# Patient Record
Sex: Male | Born: 1970 | Race: Black or African American | Hispanic: No | Smoking: Never smoker
Health system: Southern US, Community
[De-identification: ages and names within clinical notes are randomized; demographics above are authoritative.]

## PROBLEM LIST (undated history)

## (undated) DIAGNOSIS — T7840XA Allergy, unspecified, initial encounter: Secondary | ICD-10-CM

## (undated) DIAGNOSIS — E039 Hypothyroidism, unspecified: Secondary | ICD-10-CM

## (undated) DIAGNOSIS — N200 Calculus of kidney: Secondary | ICD-10-CM

## (undated) DIAGNOSIS — B009 Herpesviral infection, unspecified: Secondary | ICD-10-CM

## (undated) HISTORY — PX: LUMBAR SPINE SURGERY: SHX701

## (undated) HISTORY — DX: Allergy, unspecified, initial encounter: T78.40XA

## (undated) HISTORY — DX: Herpesviral infection, unspecified: B00.9

## (undated) HISTORY — DX: Calculus of kidney: N20.0

## (undated) HISTORY — DX: Hypothyroidism, unspecified: E03.9

---

## 2009-12-27 ENCOUNTER — Emergency Department (HOSPITAL_COMMUNITY): Admission: EM | Admit: 2009-12-27 | Discharge: 2009-12-27 | Payer: Self-pay | Admitting: Emergency Medicine

## 2010-01-14 ENCOUNTER — Emergency Department (HOSPITAL_COMMUNITY): Admission: EM | Admit: 2010-01-14 | Discharge: 2010-01-14 | Payer: Self-pay | Admitting: Emergency Medicine

## 2011-02-03 ENCOUNTER — Emergency Department (HOSPITAL_COMMUNITY): Payer: BC Managed Care – PPO

## 2011-02-03 ENCOUNTER — Emergency Department (HOSPITAL_COMMUNITY)
Admission: EM | Admit: 2011-02-03 | Discharge: 2011-02-03 | Disposition: A | Discharge status: Home / Self Care | Payer: BC Managed Care – PPO | Attending: Emergency Medicine | Admitting: Emergency Medicine

## 2011-02-03 DIAGNOSIS — R079 Chest pain, unspecified: Secondary | ICD-10-CM | POA: Insufficient documentation

## 2011-02-03 DIAGNOSIS — R002 Palpitations: Secondary | ICD-10-CM | POA: Insufficient documentation

## 2011-02-03 LAB — DIFFERENTIAL
Basophils Relative: 1 % (ref 0–1)
Eosinophils Absolute: 0.2 10*3/uL (ref 0.0–0.7)
Neutrophils Relative %: 67 % (ref 43–77)

## 2011-02-03 LAB — CBC
Platelets: 217 10*3/uL (ref 150–400)
RBC: 4.41 MIL/uL (ref 4.22–5.81)
RDW: 12.8 % (ref 11.5–15.5)
WBC: 10.3 10*3/uL (ref 4.0–10.5)

## 2011-02-03 LAB — POCT CARDIAC MARKERS
CKMB, poc: <1 ng/mL — ABNORMAL LOW (ref 1.0–8.0)
Myoglobin, poc: 66.6 ng/mL (ref 12–200)
Troponin i, poc: <0.05 ng/mL (ref 0.00–0.09)

## 2011-02-03 LAB — BASIC METABOLIC PANEL
Chloride: 106 mEq/L (ref 96–112)
Creatinine, Ser: 1.42 mg/dL (ref 0.4–1.5)
GFR calc Af Amer: >60 mL/min (ref >60)
GFR calc non Af Amer: 55 mL/min — ABNORMAL LOW (ref >60)
Potassium: 4.1 mEq/L (ref 3.5–5.1)

## 2011-03-12 ENCOUNTER — Other Ambulatory Visit: Payer: Self-pay | Admitting: Rehabilitation

## 2011-03-12 DIAGNOSIS — M542 Cervicalgia: Secondary | ICD-10-CM

## 2012-08-10 ENCOUNTER — Ambulatory Visit: Payer: BC Managed Care – PPO | Admitting: Family

## 2012-08-12 ENCOUNTER — Ambulatory Visit: Payer: BC Managed Care – PPO | Admitting: Family

## 2012-08-17 ENCOUNTER — Ambulatory Visit (INDEPENDENT_AMBULATORY_CARE_PROVIDER_SITE_OTHER): Payer: BC Managed Care – PPO | Admitting: Family

## 2012-08-17 ENCOUNTER — Encounter: Payer: Self-pay | Admitting: Family

## 2012-08-17 VITALS — BP 116/70 | HR 61 | Temp 97.8°F | Resp 12 | Ht 69.5 in | Wt 191.0 lb

## 2012-08-17 DIAGNOSIS — R599 Enlarged lymph nodes, unspecified: Secondary | ICD-10-CM

## 2012-08-17 DIAGNOSIS — R590 Localized enlarged lymph nodes: Secondary | ICD-10-CM | POA: Insufficient documentation

## 2012-08-17 DIAGNOSIS — R1030 Lower abdominal pain, unspecified: Secondary | ICD-10-CM

## 2012-08-17 DIAGNOSIS — R109 Unspecified abdominal pain: Secondary | ICD-10-CM

## 2012-08-17 DIAGNOSIS — Z0000 Encounter for general adult medical examination without abnormal findings: Secondary | ICD-10-CM | POA: Insufficient documentation

## 2012-08-17 NOTE — Patient Instructions (Addendum)
Please go to lab today. Please return fasting to the lab at your earliest convenience to complete additional lab work. We will contact you about your results.  Welcome to [facility]!

## 2012-08-17 NOTE — Assessment & Plan Note (Signed)
Patient counseled on healthy diet.  Tetanus up to date,declines flu shot. Continue regular exercise.

## 2012-08-17 NOTE — Assessment & Plan Note (Signed)
Given recent high risk sexual behavior, I have recommended an HIV test.  He declines at this time.  Reminded him on the importance of safe sex.  Will send of HSV1/2 testing, urine check for gonorrhea/chlamydia.

## 2012-08-17 NOTE — Progress Notes (Signed)
Subjective:    Patient ID: George Castro, male    DOB: [birth year], 41 y.o.   MRN: [id number]  HPI George Castro is a 41 yr old male who presents today to establish care.  He wishes to have a cpx and also to have std testing.  Preventative- works out at gym, treadmill, [employer].  Basketball/handball. Diet:  eats a lot of baked goods.   Tetanus- reports 5 yrs ago.  Declines flu shot.   Groin discomfort- Pt reports tenderness at the top of the left leg. He reports 5 male partners in the last 6 months and reports no condom use. He denies penile rashes, dysuria, or penile discharge.  He is in the process of going through a divorce and describes his recent "loose" behavior as his way of dealing with things.  He and his wife are meeting together with a therapist and he is also meeting individually with a therapist.    Review of Systems  Constitutional: Negative for unexpected weight change.  HENT: Negative for hearing loss and congestion.   Eyes:       Reading glasses  Respiratory: Negative for cough and shortness of breath.   Cardiovascular: Negative for chest pain.  Gastrointestinal: Negative for nausea, vomiting and diarrhea.  Genitourinary: Negative for dysuria, frequency and discharge.       + left groin pain  Musculoskeletal: Negative for myalgias and arthralgias.  Skin: Negative for rash.  Neurological: Negative for headaches.  Hematological: Negative for adenopathy.  Psychiatric/Behavioral:       Some stress with divorce.   Past Medical History  Diagnosis Date  . Allergy     seasonal, dust  . Depression     recent  . Kidney stones     x 4 times    History   Social History  . Marital Status: Single    Spouse Name: N/A    Number of Children: 8  . Years of Education: N/A   Occupational History  . Not on file.   Social History Main Topics  . Smoking status: Never Smoker   . Smokeless tobacco: Never Used  . Alcohol Use: Yes     occasionally pt drinks  . Drug Use: No  .  Sexually Active: Yes -- Male partner(s)   Other Topics Concern  . 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 Lumbar spine surgery     L-4 and L-5    Family History  Problem Relation Age of Onset  . Hyperlipidemia Maternal Grandmother   . Arthritis Maternal Grandmother   . Cancer Paternal Grandmother   . Alcohol abuse Paternal Grandfather     Allergies  Allergen Reactions  . Peanuts (Peanut Oil) Hives  . Shrimp (Shellfish Allergy) Hives    No current outpatient prescriptions on file prior to visit.    BP 116/70  Pulse 61  Temp 97.8 F (36.6 C) (Oral)  Resp 12  Ht 5' 9.5" (1.765 m)  Wt 191 lb (86.637 kg)  BMI 27.80 kg/m2  SpO2 97%        Objective:   Physical Exam    Physical Exam  Constitutional: He is oriented to person, place, and time. He appears well-developed and well-nourished. No distress.  HENT:  Head: Normocephalic and atraumatic.  Right Ear: Tympanic membrane and ear canal normal.  Left Ear: Tympanic membrane and ear canal normal.  Mouth/Throat: Oropharynx is clear and moist.  Eyes: Pupils are equal, round, and reactive to light. No scleral icterus.  Neck: Normal range of motion. No thyromegaly present.  Cardiovascular: Normal rate and regular rhythm.   No murmur heard. Pulmonary/Chest: Effort normal and breath sounds normal. No respiratory distress. He has no wheezes. He has no rales. He exhibits no tenderness.  Abdominal: Soft. Bowel sounds are normal. He exhibits no distension and no mass. There is no tenderness. There is no rebound and no guarding.  Musculoskeletal: He exhibits no edema.  Lymphadenopathy:    He has no cervical adenopathy. tender left inguinal LN noted- about 1 cm diameter Neurological: He is alert and oriented to person, place, and time. He has  normal reflexes. He exhibits normal muscle tone. Coordination normal.  Skin: Skin is warm and dry.  Psychiatric: He has a normal mood and affect. His behavior is normal. Judgment and thought content normal.  GU: no inguinal hernias noted on exam. No penile rashes/lesions noted.            Assessment & Plan:         Assessment & Plan:

## 2012-08-18 LAB — GC/CHLAMYDIA PROBE AMP, URINE
Chlamydia, Swab/Urine, PCR: NEGATIVE
GC Probe Amp, Urine: NEGATIVE

## 2012-08-18 LAB — HSV(HERPES SIMPLEX VRS) I + II AB-IGM: Herpes Simplex Vrs I&II-IgM Ab (EIA): 0.45 INDEX

## 2012-08-18 LAB — HSV 2 ANTIBODY, IGG: HSV 2 Glycoprotein G Ab, IgG: 8.87 IV — ABNORMAL HIGH

## 2012-08-19 ENCOUNTER — Encounter: Payer: Self-pay | Admitting: Family

## 2012-08-19 ENCOUNTER — Telehealth: Payer: Self-pay | Admitting: *Deleted

## 2012-08-19 ENCOUNTER — Telehealth: Payer: Self-pay | Admitting: Family

## 2012-08-19 ENCOUNTER — Other Ambulatory Visit: Payer: Self-pay | Admitting: Family

## 2012-08-19 DIAGNOSIS — Z0000 Encounter for general adult medical examination without abnormal findings: Secondary | ICD-10-CM

## 2012-08-19 DIAGNOSIS — B009 Herpesviral infection, unspecified: Secondary | ICD-10-CM

## 2012-08-19 HISTORY — DX: Herpesviral infection, unspecified: B00.9

## 2012-08-19 LAB — LIPID PANEL
Cholesterol: 237 mg/dL — ABNORMAL HIGH (ref 0–200)
HDL: 56 mg/dL (ref >39)
LDL Cholesterol: 164 mg/dL — ABNORMAL HIGH (ref 0–99)
Triglycerides: 87 mg/dL (ref <150)

## 2012-08-19 LAB — CBC WITH DIFFERENTIAL/PLATELET
Basophils Absolute: 0 10*3/uL (ref 0.0–0.1)
Basophils Relative: 0 % (ref 0–1)
Eosinophils Absolute: 0.1 10*3/uL (ref 0.0–0.7)
Eosinophils Relative: 1 % (ref 0–5)
HCT: 41.5 % (ref 39.0–52.0)
Hemoglobin: 14.6 g/dL (ref 13.0–17.0)
MCH: 31.5 pg (ref 26.0–34.0)
MCHC: 35.2 g/dL (ref 30.0–36.0)
MCV: 89.4 fL (ref 78.0–100.0)
Monocytes Absolute: 0.4 10*3/uL (ref 0.1–1.0)
Monocytes Relative: 6 % (ref 3–12)
RDW: 14.4 % (ref 11.5–15.5)

## 2012-08-19 LAB — BASIC METABOLIC PANEL
BUN: 12 mg/dL (ref 6–23)
CO2: 31 mEq/L (ref 19–32)
Calcium: 9.4 mg/dL (ref 8.4–10.5)
Creat: 1.29 mg/dL (ref 0.50–1.35)

## 2012-08-19 LAB — HEPATIC FUNCTION PANEL
Albumin: 4.5 g/dL (ref 3.5–5.2)
Indirect Bilirubin: 0.9 mg/dL (ref 0.0–0.9)
Total Protein: 7.5 g/dL (ref 6.0–8.3)

## 2012-08-19 MED ORDER — ACYCLOVIR 400 MG PO TABS: ORAL_TABLET | ORAL | Status: DC

## 2012-08-19 MED ORDER — DOXYCYCLINE HYCLATE 100 MG PO TABS: 100.0000 mg | ORAL_TABLET | Freq: Two times a day (BID) | ORAL | Status: DC

## 2012-08-19 NOTE — Telephone Encounter (Signed)
Pt presented to the lab. Orders entered and given to the lab.

## 2012-08-19 NOTE — Telephone Encounter (Signed)
Message copied by Kathi Simpers on Fri Aug 19, 2012 11:42 AM ------      Message from: O'SULLIVAN, MELISSA      Created: Wed Aug 17, 2012 11:59 AM       Patient will return fasting for:            CBC      BMET      LFT      TSH      FLP      UA with reflex micro            V70.0

## 2012-08-19 NOTE — Telephone Encounter (Signed)
Spoke to pt reviewed lab results.  + HSV2.  Discussed that he can pass this infection to sexual partners even if no lesions present.  Recommended condom use all the time to decrease this risk. He reports that he is still having groin swelling. Also has some pain in the left upper testicular area.  Will plan empiric rx for epididymitis with doxy.  He has apt on Monday. Plan empiric IM ceftriaxone when he comes in that day.   Will rx hsv with acyclovir.  Pt aware. Questions answered.

## 2012-08-20 LAB — URINALYSIS, ROUTINE W REFLEX MICROSCOPIC
Bilirubin Urine: NEGATIVE
Glucose, UA: NEGATIVE mg/dL
Hgb urine dipstick: NEGATIVE
Leukocytes, UA: NEGATIVE
Protein, ur: NEGATIVE mg/dL
pH: 7.5 (ref 5.0–8.0)

## 2012-08-22 ENCOUNTER — Ambulatory Visit: Payer: BC Managed Care – PPO | Admitting: Family

## 2012-08-22 DIAGNOSIS — Z0289 Encounter for other administrative examinations: Secondary | ICD-10-CM

## 2012-08-25 ENCOUNTER — Encounter: Payer: Self-pay | Admitting: Family

## 2012-08-25 ENCOUNTER — Telehealth: Payer: Self-pay | Admitting: Family

## 2012-08-25 DIAGNOSIS — E039 Hypothyroidism, unspecified: Secondary | ICD-10-CM

## 2012-08-25 HISTORY — DX: Hypothyroidism, unspecified: E03.9

## 2012-08-25 MED ORDER — LEVOTHYROXINE SODIUM 50 MCG PO TABS: 50.0000 ug | ORAL_TABLET | Freq: Every day | ORAL | Status: DC

## 2012-08-25 NOTE — Telephone Encounter (Signed)
Please call pt and let him know that his blood work shows underactive thyroid.  I would like him to add synthroid once daily and return to the lab in 1 month for follow up TSH (diagnosis is hypothyroid). Also, cholesterol is mildly elevated.  He should work on a low fat/low cholesterol diet and continue regular exercise.

## 2012-08-29 NOTE — Telephone Encounter (Signed)
Notified pt of instructions below. Pt became concerned about the cause of his current diagnosis. Discussed some causes of hypothyroidism with pt. Pt doesn't feel any of the causes discussed explain his recent diagnosis. Pt has f/u with Provider on Wednesday and will discuss his concerns in detail at that time. Future lab order entered. Copy given to the lab and mailed to pt.

## 2012-08-31 ENCOUNTER — Encounter: Payer: Self-pay | Admitting: Family

## 2012-08-31 ENCOUNTER — Ambulatory Visit (INDEPENDENT_AMBULATORY_CARE_PROVIDER_SITE_OTHER): Payer: BC Managed Care – PPO | Admitting: Family

## 2012-08-31 VITALS — BP 128/84 | HR 59 | Temp 98.4°F | Resp 16 | Wt 192.1 lb

## 2012-08-31 DIAGNOSIS — M25559 Pain in unspecified hip: Secondary | ICD-10-CM | POA: Insufficient documentation

## 2012-08-31 DIAGNOSIS — R21 Rash and other nonspecific skin eruption: Secondary | ICD-10-CM | POA: Insufficient documentation

## 2012-08-31 DIAGNOSIS — E039 Hypothyroidism, unspecified: Secondary | ICD-10-CM

## 2012-08-31 DIAGNOSIS — M25551 Pain in right hip: Secondary | ICD-10-CM | POA: Insufficient documentation

## 2012-08-31 NOTE — Assessment & Plan Note (Signed)
We discussed causes and treatment for hypothyroid. Pt education hand out was provided. Will start synthroid.  Plan follow up TSH in 1 month.

## 2012-08-31 NOTE — Progress Notes (Signed)
  Subjective:    Patient ID: George Castro, male    DOB: [birth year], 41 y.o.   MRN: [id number]  HPI  George Castro is a 41 yr old male who presents today to discuss discoloration on his left ankle.  1) Left ankle- noted darkened rash 3-4 yrs ago. Reports that he has tried antifungal creams without improvement. Area has recently become larger.  Area itches "occasionally."  2) Hip pain- notes that he has some tenderness to palpation of the left anterior hip.    3) Hypothyroid- incidental note of hypothyroid on cpx labs.       Review of Systems    see HPI  Past Medical History  Diagnosis Date  . Allergy     seasonal, dust  . Depression     recent  . Kidney stones     x 4 times  . 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 Hypothyroid 08/25/2012    History   Social History  . Marital Status: Single    Spouse Name: N/A    Number of Children: 8  . Years of Education: N/A   Occupational History  . Not on file.   Social History Main Topics  . Smoking status: Never Smoker   . Smokeless tobacco: Never Used  . Alcohol Use: Yes     occasionally pt drinks  . Drug Use: No  . Sexually Active: Yes -- Male partner(s)   Other Topics Concern  . 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 Lumbar spine surgery     L-4 and L-5    Family History  Problem Relation Age of Onset  . Hyperlipidemia Maternal Grandmother   . Arthritis Maternal Grandmother   . Cancer Paternal Grandmother   . Alcohol abuse Paternal Grandfather     Allergies  Allergen Reactions  . Peanuts (Peanut Oil) Hives  . Shrimp (Shellfish Allergy) Hives  . Penicillins Hives    Current Outpatient Prescriptions on File Prior to Visit  Medication Sig Dispense Refill  . acyclovir (ZOVIRAX) 400 MG tablet One tablet three  times daily for 5 days as needed for genital rash or swollen glands in groin.  15 tablet  5  . levothyroxine (SYNTHROID, LEVOTHROID) 50 MCG tablet Take 1 tablet (50 mcg total) by mouth daily.  30 tablet  1  . doxycycline (VIBRA-TABS) 100 MG tablet Take 1 tablet (100 mg total) by mouth 2 (two) times daily.  20 tablet  0    BP 128/84  Pulse 59  Temp 98.4 F (36.9 C) (Oral)  Resp 16  Wt 192 lb 1.9 oz (87.145 kg)  SpO2 98%    Objective:   Physical Exam  Gen: awake, alert, NAD CV: S1/S2, RRR Resp: BS cta bilaterally no w/r/r Skin: dark dry flat hyperpigmented lesion noted on left lateral ankle. Lesion about 2 inches wide. Psych: A and O x 3, calm, appropriate affect MS: full rom of left hip without pain.     Assessment & Plan:

## 2012-08-31 NOTE — Assessment & Plan Note (Signed)
We discussed possible referral to sports medicine but he declines at this time.

## 2012-08-31 NOTE — Assessment & Plan Note (Signed)
Rash looks fungal but he reports no improvement with antifungal creams. Refer to dermatology.

## 2012-08-31 NOTE — Patient Instructions (Addendum)
Please call if your hip pain worsens or if no improvement in 1 month. Start thyroid medication and return to lab in 1 month so that we can recheck thyroid blood work. You will be contact about your referral to dermatology.  Please let us know if you have not heard back within 1 week about your referral.

## 2012-09-01 ENCOUNTER — Telehealth: Payer: Self-pay | Admitting: *Deleted

## 2012-09-01 DIAGNOSIS — R1030 Lower abdominal pain, unspecified: Secondary | ICD-10-CM

## 2012-09-01 MED ORDER — MELOXICAM 7.5 MG PO TABS: 7.5000 mg | ORAL_TABLET | Freq: Every day | ORAL | Status: DC

## 2012-09-01 MED ORDER — CIPROFLOXACIN HCL 500 MG PO TABS: 500.0000 mg | ORAL_TABLET | Freq: Two times a day (BID) | ORAL | Status: DC

## 2012-09-01 NOTE — Telephone Encounter (Signed)
LMOM with contact name & number for return call RE: response from provider/SLS

## 2012-09-01 NOTE — Telephone Encounter (Signed)
Called pt, no answer did not leave message.  Please try pt back later.  When you speak to him let him know that I have sent rx to his pharmacy for cipro an antibiotic.  This can help in case the pain is related to prostatitis (a common infection of the prostate).  I also have placed an order below for scrotal ultrasound to check for any abnormality of his testicles given his ongoing pain. Rx sent for mobic once daily as needed for pain as well.

## 2012-09-01 NOTE — Telephone Encounter (Signed)
Received message from pt stating he is still having groin / testicle pain. Reports that previous medication has helped some but he is requesting something to help take the pain away.  Please advise.

## 2012-09-02 ENCOUNTER — Telehealth: Payer: Self-pay | Admitting: Family

## 2012-09-02 NOTE — Telephone Encounter (Signed)
Documentation error below, unable to reach pt or leave message.

## 2012-09-02 NOTE — Telephone Encounter (Signed)
See additional phone note from 09/02/12.

## 2012-09-02 NOTE — Telephone Encounter (Signed)
Left message on home # for pt to return my call. 

## 2012-09-02 NOTE — Telephone Encounter (Signed)
Notified pt, unable to reach pt.

## 2012-09-02 NOTE — Telephone Encounter (Signed)
No, we will start with ultrasound. He should go to er if he develops worsening or severe pain.

## 2012-09-02 NOTE — Telephone Encounter (Signed)
Received call from imaging wanting to know if we want arterial doppler added on to the scrotal u/s pt will have on Monday?  Please advise.

## 2012-09-02 NOTE — Telephone Encounter (Signed)
Caller: Ellijah/Patient; Patient Name: George Castro; PCP: Peggyann Juba, Melissa (Adults only); Best Callback Phone Number: [phone]  Patient states he received a voicemail message to return call to [place]. RN reviewed Counselling psychologist Health Record. Documentation noted per Sandford Craze NP from 09/01/12: sent rx to his pharmacy for cipro an antibiotic.  This can help in case the pain is related to prostatitis (a common infection of the prostate).  I also have placed an order below for scrotal ultrasound to check for any abnormality of his testicles given his ongoing pain. Rx sent for mobic once daily as needed for pain as well.  Patient provided with above information. Patient informed that Rx medications were sent to [facility] on [address] at [address]. Patient informed that Ultrasound order was sent to [facility]. Patient verbalizes understanding.

## 2012-09-05 ENCOUNTER — Ambulatory Visit (HOSPITAL_BASED_OUTPATIENT_CLINIC_OR_DEPARTMENT_OTHER): Admission: RE | Admit: 2012-09-05 | Payer: BC Managed Care – PPO | Source: Ambulatory Visit

## 2012-09-07 NOTE — Telephone Encounter (Signed)
Per verbal from Provider, ok to refill meloxicam but hold off on antibiotic as pt should complete u/s in order for [place] to determine source of pain and proper treatment.

## 2012-09-07 NOTE — Telephone Encounter (Signed)
Received voicemail from pt requesting additional rx for doxycycline and meloxicam to be sent to [facility]. Upon review of chart it appears pt did not show for u/s. Verified with imaging that pt did not show and u/s has not been r/s. Attempted to reach pt and left message to return my call.

## 2012-09-08 ENCOUNTER — Telehealth: Payer: Self-pay | Admitting: Family

## 2012-09-08 MED ORDER — CIPROFLOXACIN HCL 500 MG PO TABS: 500.0000 mg | ORAL_TABLET | Freq: Two times a day (BID) | ORAL | Status: DC

## 2012-09-08 MED ORDER — MELOXICAM 7.5 MG PO TABS: 7.5000 mg | ORAL_TABLET | Freq: Every day | ORAL | Status: DC

## 2012-09-08 NOTE — Telephone Encounter (Signed)
Spoke with pt and advised him of below instructions. Rxs sent to [facility] and pt was transferred to radiology to r/s ultrasound.

## 2012-09-08 NOTE — Telephone Encounter (Signed)
After further thought, if pt is still having pain I would like him to star cipro 500 mg bid x 10 days. Please reschedule scrotal US with arterial doppler. Ok to refill meloxicam. Thanks.

## 2012-09-08 NOTE — Telephone Encounter (Signed)
Refills completed, see 09/08/12 phone note.

## 2012-09-09 ENCOUNTER — Ambulatory Visit (HOSPITAL_BASED_OUTPATIENT_CLINIC_OR_DEPARTMENT_OTHER)
Admission: RE | Admit: 2012-09-09 | Discharge: 2012-09-09 | Disposition: A | Discharge status: Home / Self Care | Payer: BC Managed Care – PPO | Source: Ambulatory Visit | Attending: Family | Admitting: Family

## 2012-09-09 ENCOUNTER — Telehealth: Payer: Self-pay | Admitting: Family

## 2012-09-09 DIAGNOSIS — N509 Disorder of male genital organs, unspecified: Secondary | ICD-10-CM | POA: Insufficient documentation

## 2012-09-09 DIAGNOSIS — R1030 Lower abdominal pain, unspecified: Secondary | ICD-10-CM

## 2012-09-09 MED ORDER — ACYCLOVIR 400 MG PO TABS: 400.0000 mg | ORAL_TABLET | Freq: Two times a day (BID) | ORAL | Status: DC

## 2012-09-09 MED ORDER — ACYCLOVIR 400 MG PO TABS: ORAL_TABLET | ORAL | Status: DC

## 2012-09-09 NOTE — Telephone Encounter (Signed)
Spoke with pt. Reviewed normal ultrasound.  He reports he continues to have left sided groin discomfort.  Could still be related to HSV2 infection.  Will give valtrex as suppressive therapy. Continue empiric cipro for possible prostatitis.  meloxicam for pain.  We again discussed importance of hiv screening.  Pt reports "I'm not there yet."  Instructed pt to follow up in 1 month- sooner if problems. Pt verbalizes understanding.

## 2012-09-09 NOTE — Addendum Note (Signed)
Addended by: Sandford Craze on: 09/09/2012 01:04 PM   Modules accepted: Orders

## 2012-09-09 NOTE — Addendum Note (Signed)
Addended by: Sandford Craze on: 09/09/2012 02:08 PM   Modules accepted: Orders

## 2012-11-07 ENCOUNTER — Ambulatory Visit (INDEPENDENT_AMBULATORY_CARE_PROVIDER_SITE_OTHER): Payer: BC Managed Care – PPO | Admitting: Family

## 2012-11-07 ENCOUNTER — Encounter: Payer: Self-pay | Admitting: Family

## 2012-11-07 VITALS — BP 120/86 | HR 64 | Temp 99.8°F | Resp 16 | Wt 200.1 lb

## 2012-11-07 DIAGNOSIS — E039 Hypothyroidism, unspecified: Secondary | ICD-10-CM

## 2012-11-07 DIAGNOSIS — R590 Localized enlarged lymph nodes: Secondary | ICD-10-CM

## 2012-11-07 DIAGNOSIS — M25559 Pain in unspecified hip: Secondary | ICD-10-CM

## 2012-11-07 DIAGNOSIS — R599 Enlarged lymph nodes, unspecified: Secondary | ICD-10-CM

## 2012-11-07 DIAGNOSIS — Z7251 High risk heterosexual behavior: Secondary | ICD-10-CM

## 2012-11-07 DIAGNOSIS — Z202 Contact with and (suspected) exposure to infections with a predominantly sexual mode of transmission: Secondary | ICD-10-CM | POA: Insufficient documentation

## 2012-11-07 DIAGNOSIS — Z9189 Other specified personal risk factors, not elsewhere classified: Secondary | ICD-10-CM

## 2012-11-07 MED ORDER — MELOXICAM 7.5 MG PO TABS: 7.5000 mg | ORAL_TABLET | Freq: Every day | ORAL | Status: DC

## 2012-11-07 MED ORDER — LEVOTHYROXINE SODIUM 50 MCG PO TABS: 50.0000 ug | ORAL_TABLET | Freq: Every day | ORAL | Status: DC

## 2012-11-07 NOTE — Assessment & Plan Note (Signed)
Resume synthroid, plan to check tsh in 1 month.

## 2012-11-07 NOTE — Assessment & Plan Note (Signed)
Pt reports that condom broke during intercourse. He would like std testing, obtain rpr, GC/Clamydia.  Declines HIV testing

## 2012-11-07 NOTE — Progress Notes (Signed)
  Subjective:    Patient ID: George Castro, male    DOB: Aug 04, 1971, 42 y.o.   MRN: [id number]  HPI  George Castro is a 42 yr old male who presents today requesting STD testing.  Cough/nasal congestion x 2 days. + sick contacts   Hypothyroid- has been out of synthoid x "a few months."   Groin pain- pt reports pain in the left upper anterior thigh, occasionally groin and in left buttock overlying SI joint.  Pain improves with stretching.     Review of Systems See HPI  Past Medical History  Diagnosis Date  . Allergy     seasonal, dust  . Depression     recent  . Kidney stones     x 4 times  . 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 Hypothyroid 08/25/2012    History   Social History  . Marital Status: Single    Spouse Name: N/A    Number of Children: 8  . Years of Education: N/A   Occupational History  . Not on file.   Social History Main Topics  . Smoking status: Never Smoker   . Smokeless tobacco: Never Used  . Alcohol Use: Yes     Comment: occasionally pt drinks  . Drug Use: No  . Sexually Active: Yes -- Male partner(s)   Other Topics Concern  . 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 Lumbar spine surgery     L-4 and L-5    Family History  Problem Relation Age of Onset  . Hyperlipidemia Maternal Grandmother   . Arthritis Maternal Grandmother   . Cancer Paternal Grandmother   . Alcohol abuse Paternal Grandfather     Allergies  Allergen Reactions  . Peanuts (Peanut Oil) Hives  . Shrimp (Shellfish Allergy) Hives  . Penicillins Hives    Current Outpatient Prescriptions on File Prior to Visit  Medication Sig Dispense Refill  . acyclovir (ZOVIRAX) 400 MG tablet One tablet by mouth twice daily  60 tablet  2    BP 120/86  Pulse 64  Temp 99.8 F (37.7 C)  (Oral)  Resp 16  Wt 200 lb 1.3 oz (90.756 kg)  SpO2 97%       Objective:   Physical Exam  Constitutional: He is oriented to person, place, and time. He appears well-developed and well-nourished. No distress.  HENT:  Right Ear: Tympanic membrane and ear canal normal.  Left Ear: Tympanic membrane and ear canal normal.  Mouth/Throat: No oropharyngeal exudate or posterior oropharyngeal edema.  Cardiovascular: Normal rate and regular rhythm.   No murmur heard. Pulmonary/Chest: Effort normal and breath sounds normal. No respiratory distress. He has no wheezes. He has no rales. He exhibits no tenderness.  Lymphadenopathy:    He has no cervical adenopathy.  Neurological: He is alert and oriented to person, place, and time.  Psychiatric: He has a normal mood and affect. His behavior is normal. Judgment and thought content normal.          Assessment & Plan:  URI- recommended supportive measures and to call if symptoms worsen or do not improve in the next few days.

## 2012-11-07 NOTE — Assessment & Plan Note (Signed)
Trial of meloxicam.  If no improvement plan referral to sports medicine.

## 2012-11-07 NOTE — Patient Instructions (Addendum)
Please complete your lab work prior to leaving.  Follow up in 1 month to have your thyroid level checked in the lab. Happy New Year!

## 2012-11-08 ENCOUNTER — Telehealth: Payer: Self-pay | Admitting: Family

## 2012-11-08 LAB — RPR

## 2012-11-08 LAB — GC/CHLAMYDIA PROBE AMP, URINE
Chlamydia, Swab/Urine, PCR: NEGATIVE
GC Probe Amp, Urine: NEGATIVE

## 2012-11-08 NOTE — Telephone Encounter (Signed)
His cold is viral- abx are not indicated. he can try otc med such as dayquil prn .  If symptoms are not resolved by 7-10 days, he should let us know.

## 2012-11-08 NOTE — Telephone Encounter (Signed)
Please advise 

## 2012-11-08 NOTE — Telephone Encounter (Signed)
Per verbal from Provider, pt has been notified.

## 2012-11-08 NOTE — Telephone Encounter (Signed)
Patient states that he thought Efraim Kaufmann was going to call him in something for his cold. He said that there were only two medication at the pharmacy yesterday when he went by there.

## 2012-11-08 NOTE — Telephone Encounter (Signed)
Attempted to reach pt, left message on cell # to return my call. (also see lab note).

## 2012-11-11 ENCOUNTER — Telehealth: Payer: Self-pay | Admitting: Family

## 2012-11-11 NOTE — Telephone Encounter (Signed)
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   Fax# where this needs to go is [phone]

## 2012-11-11 NOTE — Telephone Encounter (Signed)
Notified pt that he did not provide this information to [place] when he established care in 08/2012 and we have no record of previous immunizations. Advised pt to contact previous Provider, college or high school. Pt voices understanding.

## 2012-12-05 ENCOUNTER — Ambulatory Visit (INDEPENDENT_AMBULATORY_CARE_PROVIDER_SITE_OTHER): Payer: BC Managed Care – PPO | Admitting: Family

## 2012-12-05 ENCOUNTER — Encounter: Payer: Self-pay | Admitting: Family

## 2012-12-05 VITALS — BP 120/80 | HR 60 | Temp 98.0°F | Resp 16 | Ht 69.5 in | Wt 201.0 lb

## 2012-12-05 DIAGNOSIS — Z91018 Allergy to other foods: Secondary | ICD-10-CM

## 2012-12-05 DIAGNOSIS — E039 Hypothyroidism, unspecified: Secondary | ICD-10-CM

## 2012-12-05 DIAGNOSIS — T781XXA Other adverse food reactions, not elsewhere classified, initial encounter: Secondary | ICD-10-CM

## 2012-12-05 MED ORDER — EPINEPHRINE 0.3 MG/0.3ML IJ DEVI: INTRAMUSCULAR | Status: DC

## 2012-12-05 MED ORDER — LEVOTHYROXINE SODIUM 50 MCG PO TABS: 50.0000 ug | ORAL_TABLET | Freq: Every day | ORAL | Status: DC

## 2012-12-05 NOTE — Assessment & Plan Note (Signed)
Reports feeling sleepy, but he is only getting about 6 hours a night of sleep.  Recommended that he try to aim for 8 hours a night. Check TSH, adjust as needed.

## 2012-12-05 NOTE — Progress Notes (Signed)
  Subjective:    Patient ID: George Castro, male    DOB: 1971-01-10, 42 y.o.   MRN: [id number]  HPI  George Castro is a 42 yr old male who presents today for follow up of his hypothyroidism.  He is currently maintained on levothyroxind 50 mg.  Reports feeling tired.  He started classes at [employer] and has been walking more.  Feels tired.      Review of Systems    see HPI  Past Medical History  Diagnosis Date  . Allergy     seasonal, dust  . Depression     recent  . Kidney stones     x 4 times  . 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 Hypothyroid 08/25/2012    History   Social History  . Marital Status: Single    Spouse Name: N/A    Number of Children: 8  . Years of Education: N/A   Occupational History  . Not on file.   Social History Main Topics  . Smoking status: Never Smoker   . Smokeless tobacco: Never Used  . Alcohol Use: Yes     Comment: occasionally pt drinks  . Drug Use: No  . Sexually Active: Yes -- Male partner(s)   Other Topics Concern  . 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 Lumbar spine surgery     L-4 and L-5    Family History  Problem Relation Age of Onset  . Hyperlipidemia Maternal Grandmother   . Arthritis Maternal Grandmother   . Cancer Paternal Grandmother   . Alcohol abuse Paternal Grandfather     Allergies  Allergen Reactions  . Peanuts (Peanut Oil) Hives  . Shrimp (Shellfish Allergy) Hives  . Penicillins Hives    Current Outpatient Prescriptions on File Prior to Visit  Medication Sig Dispense Refill  . acyclovir (ZOVIRAX) 400 MG tablet One tablet by mouth twice daily  60 tablet  2  . levothyroxine (SYNTHROID, LEVOTHROID) 50 MCG tablet Take 1 tablet (50 mcg total) by mouth daily.  30 tablet  0  . meloxicam (MOBIC) 7.5 MG tablet Take  1 tablet (7.5 mg total) by mouth daily.  12 tablet  0    BP 120/80  Pulse 60  Temp 98 F (36.7 C) (Oral)  Resp 16  Ht 5' 9.5" (1.765 m)  Wt 201 lb (91.173 kg)  BMI 29.26 kg/m2  SpO2 98%    Objective:   Physical Exam  Constitutional: He is oriented to person, place, and time. He appears well-developed and well-nourished. No distress.  Cardiovascular: Normal rate and regular rhythm.   No murmur heard. Musculoskeletal: He exhibits no edema.  Neurological: He is alert and oriented to person, place, and time.  Skin: Skin is warm and dry.  Psychiatric: He has a normal mood and affect. His behavior is normal. Judgment and thought content normal.          Assessment & Plan:

## 2012-12-05 NOTE — Assessment & Plan Note (Signed)
Pt with multiple food allergies. We discussed importance of keeping epi pen on hand in case of severe allergic reaction.  If symptoms are severe he is instructed to use epi pen and go directly to the ED.  Pt verbalizes understanding.

## 2012-12-05 NOTE — Patient Instructions (Addendum)
Please complete your lab work prior to leaving. Please schedule a follow up appointment in 6 months.   

## 2013-03-23 ENCOUNTER — Emergency Department (HOSPITAL_BASED_OUTPATIENT_CLINIC_OR_DEPARTMENT_OTHER): Payer: BC Managed Care – PPO

## 2013-03-23 ENCOUNTER — Encounter (HOSPITAL_BASED_OUTPATIENT_CLINIC_OR_DEPARTMENT_OTHER): Payer: Self-pay | Admitting: *Deleted

## 2013-03-23 ENCOUNTER — Emergency Department (HOSPITAL_BASED_OUTPATIENT_CLINIC_OR_DEPARTMENT_OTHER)
Admission: EM | Admit: 2013-03-23 | Discharge: 2013-03-23 | Disposition: A | Discharge status: Home / Self Care | Payer: BC Managed Care – PPO | Attending: Emergency Medicine | Admitting: Emergency Medicine

## 2013-03-23 DIAGNOSIS — Z87442 Personal history of urinary calculi: Secondary | ICD-10-CM | POA: Insufficient documentation

## 2013-03-23 DIAGNOSIS — E039 Hypothyroidism, unspecified: Secondary | ICD-10-CM | POA: Insufficient documentation

## 2013-03-23 DIAGNOSIS — S0993XA Unspecified injury of face, initial encounter: Secondary | ICD-10-CM | POA: Insufficient documentation

## 2013-03-23 DIAGNOSIS — M20019 Mallet finger of unspecified finger(s): Secondary | ICD-10-CM | POA: Insufficient documentation

## 2013-03-23 DIAGNOSIS — F3289 Other specified depressive episodes: Secondary | ICD-10-CM | POA: Insufficient documentation

## 2013-03-23 DIAGNOSIS — Z79899 Other long term (current) drug therapy: Secondary | ICD-10-CM | POA: Insufficient documentation

## 2013-03-23 DIAGNOSIS — S6980XA Other specified injuries of unspecified wrist, hand and finger(s), initial encounter: Secondary | ICD-10-CM | POA: Insufficient documentation

## 2013-03-23 DIAGNOSIS — M20011 Mallet finger of right finger(s): Secondary | ICD-10-CM

## 2013-03-23 DIAGNOSIS — IMO0002 Reserved for concepts with insufficient information to code with codable children: Secondary | ICD-10-CM | POA: Insufficient documentation

## 2013-03-23 DIAGNOSIS — Y9389 Activity, other specified: Secondary | ICD-10-CM | POA: Insufficient documentation

## 2013-03-23 DIAGNOSIS — T07XXXA Unspecified multiple injuries, initial encounter: Secondary | ICD-10-CM

## 2013-03-23 DIAGNOSIS — B009 Herpesviral infection, unspecified: Secondary | ICD-10-CM | POA: Insufficient documentation

## 2013-03-23 DIAGNOSIS — S6990XA Unspecified injury of unspecified wrist, hand and finger(s), initial encounter: Secondary | ICD-10-CM | POA: Insufficient documentation

## 2013-03-23 DIAGNOSIS — Z880 Allergy status to penicillin: Secondary | ICD-10-CM | POA: Insufficient documentation

## 2013-03-23 DIAGNOSIS — F329 Major depressive disorder, single episode, unspecified: Secondary | ICD-10-CM | POA: Insufficient documentation

## 2013-03-23 DIAGNOSIS — Y92410 Unspecified street and highway as the place of occurrence of the external cause: Secondary | ICD-10-CM | POA: Insufficient documentation

## 2013-03-23 LAB — CBC WITH DIFFERENTIAL/PLATELET
Basophils Absolute: 0 10*3/uL (ref 0.0–0.1)
Basophils Relative: 0 % (ref 0–1)
Eosinophils Relative: 1 % (ref 0–5)
HCT: 41.5 % (ref 39.0–52.0)
Hemoglobin: 14.3 g/dL (ref 13.0–17.0)
MCHC: 34.5 g/dL (ref 30.0–36.0)
MCV: 91.8 fL (ref 78.0–100.0)
Monocytes Absolute: 1 10*3/uL (ref 0.1–1.0)
Monocytes Relative: 10 % (ref 3–12)
Neutro Abs: 7.2 10*3/uL (ref 1.7–7.7)
RDW: 12.6 % (ref 11.5–15.5)

## 2013-03-23 LAB — URINALYSIS, ROUTINE W REFLEX MICROSCOPIC
Glucose, UA: NEGATIVE mg/dL
Hgb urine dipstick: NEGATIVE
Ketones, ur: 15 mg/dL — AB
Protein, ur: NEGATIVE mg/dL
pH: 5.5 (ref 5.0–8.0)

## 2013-03-23 LAB — BASIC METABOLIC PANEL
BUN: 15 mg/dL (ref 6–23)
CO2: 27 mEq/L (ref 19–32)
Calcium: 9.4 mg/dL (ref 8.4–10.5)
Chloride: 104 mEq/L (ref 96–112)
Creatinine, Ser: 1.4 mg/dL — ABNORMAL HIGH (ref 0.50–1.35)
GFR calc Af Amer: 70 mL/min — ABNORMAL LOW (ref >90)

## 2013-03-23 MED ORDER — HYDROCODONE-ACETAMINOPHEN 5-325 MG PO TABS
1.0000 | ORAL_TABLET | Freq: Once | ORAL | Status: AC
  Administered 2013-03-23: 1 via ORAL
  Filled 2013-03-23: qty 1

## 2013-03-23 MED ORDER — HYDROCODONE-ACETAMINOPHEN 5-325 MG PO TABS: 1.0000 | ORAL_TABLET | ORAL | Status: DC | PRN

## 2013-03-23 MED ORDER — SILVER SULFADIAZINE 1 % EX CREA
TOPICAL_CREAM | CUTANEOUS | Status: AC
  Administered 2013-03-23: 15:00:00
  Filled 2013-03-23: qty 85

## 2013-03-23 MED ORDER — SODIUM CHLORIDE 0.9 % IV SOLN
INTRAVENOUS | Status: DC
  Administered 2013-03-23: 13:00:00 via INTRAVENOUS

## 2013-03-23 MED ORDER — IOHEXOL 300 MG/ML  SOLN
100.0000 mL | Freq: Once | INTRAMUSCULAR | Status: AC | PRN
  Administered 2013-03-23: 100 mL via INTRAVENOUS

## 2013-03-23 NOTE — ED Notes (Signed)
John, EMT remains at bedside for wound care.

## 2013-03-23 NOTE — ED Notes (Signed)
Report received from Melissa, RN, care assumed.
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
## 2013-03-23 NOTE — ED Notes (Signed)
Patient transported to CT 

## 2013-03-27 ENCOUNTER — Encounter (HOSPITAL_BASED_OUTPATIENT_CLINIC_OR_DEPARTMENT_OTHER): Payer: Self-pay | Admitting: *Deleted

## 2013-03-27 ENCOUNTER — Emergency Department (HOSPITAL_BASED_OUTPATIENT_CLINIC_OR_DEPARTMENT_OTHER)
Admission: EM | Admit: 2013-03-27 | Discharge: 2013-03-27 | Disposition: A | Discharge status: Home / Self Care | Payer: BC Managed Care – PPO | Attending: Emergency Medicine | Admitting: Emergency Medicine

## 2013-03-27 DIAGNOSIS — Z87442 Personal history of urinary calculi: Secondary | ICD-10-CM | POA: Insufficient documentation

## 2013-03-27 DIAGNOSIS — Z8659 Personal history of other mental and behavioral disorders: Secondary | ICD-10-CM | POA: Insufficient documentation

## 2013-03-27 DIAGNOSIS — Z880 Allergy status to penicillin: Secondary | ICD-10-CM | POA: Insufficient documentation

## 2013-03-27 DIAGNOSIS — Y92410 Unspecified street and highway as the place of occurrence of the external cause: Secondary | ICD-10-CM | POA: Insufficient documentation

## 2013-03-27 DIAGNOSIS — S70929A Unspecified superficial injury of unspecified thigh, initial encounter: Secondary | ICD-10-CM | POA: Insufficient documentation

## 2013-03-27 DIAGNOSIS — Y9389 Activity, other specified: Secondary | ICD-10-CM | POA: Insufficient documentation

## 2013-03-27 DIAGNOSIS — Z79899 Other long term (current) drug therapy: Secondary | ICD-10-CM | POA: Insufficient documentation

## 2013-03-27 DIAGNOSIS — T07XXXA Unspecified multiple injuries, initial encounter: Secondary | ICD-10-CM

## 2013-03-27 DIAGNOSIS — Z8619 Personal history of other infectious and parasitic diseases: Secondary | ICD-10-CM | POA: Insufficient documentation

## 2013-03-27 DIAGNOSIS — E039 Hypothyroidism, unspecified: Secondary | ICD-10-CM | POA: Insufficient documentation

## 2013-03-27 DIAGNOSIS — S70919A Unspecified superficial injury of unspecified hip, initial encounter: Secondary | ICD-10-CM | POA: Insufficient documentation

## 2013-03-27 DIAGNOSIS — S90919A Unspecified superficial injury of unspecified ankle, initial encounter: Secondary | ICD-10-CM | POA: Insufficient documentation

## 2013-03-27 DIAGNOSIS — S80929A Unspecified superficial injury of unspecified lower leg, initial encounter: Secondary | ICD-10-CM | POA: Insufficient documentation

## 2013-03-27 MED ORDER — SILVER SULFADIAZINE 1 % EX CREA: TOPICAL_CREAM | Freq: Every day | CUTANEOUS | Status: DC

## 2013-03-27 MED ORDER — SILVER SULFADIAZINE 1 % EX CREA
TOPICAL_CREAM | Freq: Once | CUTANEOUS | Status: AC
  Administered 2013-03-27: 19:00:00 via TOPICAL
  Filled 2013-03-27: qty 85

## 2013-03-27 MED ORDER — IBUPROFEN 800 MG PO TABS
800.0000 mg | ORAL_TABLET | Freq: Once | ORAL | Status: AC
  Administered 2013-03-27: 800 mg via ORAL
  Filled 2013-03-27: qty 1

## 2013-03-27 MED ORDER — SULFAMETHOXAZOLE-TRIMETHOPRIM 800-160 MG PO TABS: 1.0000 | ORAL_TABLET | Freq: Two times a day (BID) | ORAL | Status: DC

## 2013-03-27 MED ORDER — IBUPROFEN 800 MG PO TABS: 800.0000 mg | ORAL_TABLET | Freq: Three times a day (TID) | ORAL | Status: DC

## 2013-03-27 NOTE — ED Provider Notes (Signed)
Medical screening examination/treatment/procedure(s) were performed by non-physician practitioner and as supervising physician I was immediately available for consultation/collaboration.   Charles B. Bernette Mayers, MD 03/27/13 1610
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
## 2013-04-24 ENCOUNTER — Other Ambulatory Visit: Payer: Self-pay | Admitting: Family

## 2013-06-05 ENCOUNTER — Ambulatory Visit: Payer: BC Managed Care – PPO | Admitting: Family
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
## 2014-04-01 ENCOUNTER — Ambulatory Visit (INDEPENDENT_AMBULATORY_CARE_PROVIDER_SITE_OTHER): Payer: BC Managed Care – PPO | Admitting: Emergency Medicine

## 2014-04-01 VITALS — BP 128/80 | HR 68 | Temp 97.9°F | Ht 69.25 in | Wt 184.8 lb

## 2014-04-01 DIAGNOSIS — A64 Unspecified sexually transmitted disease: Secondary | ICD-10-CM

## 2014-04-01 MED ORDER — DOXYCYCLINE HYCLATE 100 MG PO CAPS: 100.0000 mg | ORAL_CAPSULE | Freq: Two times a day (BID) | ORAL | Status: DC

## 2014-04-01 MED ORDER — CEFTRIAXONE SODIUM 1 G IJ SOLR
250.0000 mg | Freq: Once | INTRAMUSCULAR | Status: AC
  Administered 2014-04-01: 250 mg via INTRAMUSCULAR

## 2014-04-01 NOTE — Progress Notes (Signed)
[facility] [address], [place] [address] [phone]  Date:  04/01/2014   Name:  George Castro   DOB:  1971-04-12   MRN:  [id number]  PCP:  No PCP Per Patient    Chief Complaint: std sx   History of Present Illness:  JAYC GOLASZEWSKI is a 43 y.o. very pleasant male patient who presents with the following:  Had intercourse with a new friend and his condom slipped off.  He now has a creamy purulent urethral discharge.  No rash or blisters.  History of one prior episode of STD remotely.  No improvement with over the counter medications or other home remedies. Denies other complaint or health concern today.   Patient Active Problem List   Diagnosis Date Noted  . Multiple food allergies 12/05/2012  . Hypothyroid 08/25/2012  . 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 Routine general medical examination at a health care facility 08/17/2012    Past Medical History  Diagnosis Date  . Allergy     seasonal, dust  . Depression     recent  . Kidney stones     x 4 times  . 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 Hypothyroid 08/25/2012    Past Surgical History  Procedure Laterality Date  . Lumbar spine surgery      L-4 and L-5    History  Substance Use Topics  . Smoking status: Never Smoker   . Smokeless tobacco: Never Used  . Alcohol Use: Yes     Comment: occasionally pt drinks    Family History  Problem Relation Age of Onset  . Hyperlipidemia Maternal Grandmother   . Arthritis Maternal Grandmother   . Cancer Paternal Grandmother   . Alcohol abuse Paternal Grandfather     Allergies  Allergen Reactions  . Peanuts [Peanut Oil] Hives  . Shrimp [Shellfish Allergy] Hives  . Penicillins Hives    Medication list has been reviewed and updated.  Current Outpatient Prescriptions on File Prior to Visit  Medication Sig Dispense Refill  . EPINEPHrine (EPI-PEN) 0.3 mg/0.3 mL DEVI Inject 0.3mg  into thigh muscle once in  case of severe allergic reaction, then go to ER  2 Device  0   No current facility-administered medications on file prior to visit.    Review of Systems:  As per HPI, otherwise negative.    Physical Examination: Filed Vitals:   04/01/14 1434  BP: 128/80  Pulse: 68  Temp: 97.9 F (36.6 C)   Filed Vitals:   04/01/14 1434  Height: 5' 9.25" (1.759 m)  Weight: 184 lb 12.8 oz (83.825 kg)   Body mass index is 27.09 kg/(m^2). Ideal Body Weight: Weight in (lb) to have BMI = 25: 170.2   GEN: WDWN, NAD, Non-toxic, Alert & Oriented x 3 HEENT: Atraumatic, Normocephalic.  Ears and Nose: No external deformity. EXTR: No clubbing/cyanosis/edema NEURO: Normal gait.  PSYCH: Normally interactive. Conversant. Not depressed or anxious appearing.  Calm demeanor.  Genitalia:  Normal male circumcised.  Yellow purulent thick drainage.   Assessment and Plan: STD Labs Rocephin Doxy  Signed,  Phillips Odor, MD

## 2014-04-01 NOTE — Patient Instructions (Signed)
Gonorrhea °Gonorrhea is an infection that can cause serious problems. If left untreated, may  °· Damage the male or male organs.   °· Cause women to be unable to have children (sterility).   °· Harm a fetus, if the infected woman is pregnant.   °It is important to get treatment for gonorrhea as soon as possible. It is also necessary that all your sexual partners be tested for the infection.  °CAUSES  °Gonorrhea is caused by bacteria called Neisseria gonorrhoeae. The infection is spread from person to person, usually by sexual contact (such as by anal, vaginal, or oral means). A newborn can contract the infection from his or her mother during birth.  °SYMPTOMS  °Some people with gonorrhea do not have symptoms. Symptoms may be different in females and males.  °Females  °The most common symptoms are:  °· Pain in the lower abdomen.   °· Fever with or without chills.   °Other symptoms include:  °· Abnormal vaginal discharge.   °· Painful intercourse.   °· Burning or itching of the vagina or lips of the vagina.   °· Abnormal vaginal bleeding.   °· Pain when urinating.   °· Long-lasting (chronic) pain in the lower abdomen, especially during menstruation or intercourse.   °· Inability to become pregnant.   °· Going into premature labor.   °· Irritation, pain, bleeding, or discharge from the rectum. This may occur if the infection was spread by anal sex.   °· Sore throat or swollen neck lymph nodes. This may occur if the infection was spread by oral sex.   °Males  °The most common symptoms are:  °· Discharge from the penis.   °· Pain or burning during urination.   °· Pain or swelling in the testicles. °Other symptoms may include:  °· Irritation, pain, bleeding, or discharge from the rectum. This may occur if the infection was spread by anal sex.   °· Sore throat, fever, or swollen neck lymph nodes. This may occur if the infection was spread by oral sex.   °DIAGNOSIS  °A diagnosis is made after a physical exam is done and a  sample of discharge is examined under a microscope for the presence of the bacteria. The discharge may be taken from the urethra, cervix, throat, or rectum.  °TREATMENT  °Gonorrhea is treated with antibiotic medicines. It is important for treatment to begin as soon as possible. Early treatment may prevent some problems from developing.  °HOME CARE INSTRUCTIONS  °· Only take over-the-counter or prescription medicines for pain, fever, or discomfort as directed by your health care provider.   °· Take antibiotics as directed. Make sure you finish them even if you start to feel better. Incomplete treatment will put you at risk for continued infection.   °· Do not have sex until treatment is complete or as directed by your health care provider.   °· Follow up with your health care provider as directed.   °· Not all test results are available during your visit. If your test results are not back during the visit, make an appointment with your health care provider to find out the results. Do not assume everything is normal if you have not heard from your health care provider or the medical facility. It is important for you to follow up on all of your test results.   °· If you test positive for gonorrhea, inform your recent sexual partners. They need to be checked for gonorrhea even if they do not have symptoms. They may need treatment, even if they test negative for gonorrhea.   °SEEK MEDICAL CARE IF:  °· You develop any bad   reaction to the medicine you were prescribed. This may include:   °· A rash.   °· Nausea.   °· Vomiting.   °· Diarrhea.   °· Your symptoms do not improve after a few days of taking antibiotics.   °· Your symptoms get worse.   °· You develop increased pain, such as in the testicles (for males) or in the abdomen (for females).   °SEEK IMMEDIATE MEDICAL CARE IF: °· You have a fever or persistent symptoms for more than 2 3 days.   °· You have a fever and your symptoms suddenly get worse.   °MAKE SURE YOU:    °· Understand these instructions. °· Will watch your condition. °· Will get help right away if you are not doing well or get worse. °Document Released: 10/16/2000 Document Revised: 08/09/2013 Document Reviewed: 04/26/2013 °Exit[employer]® Patient Information ©2014 [employer]. ° °

## 2014-04-02 ENCOUNTER — Other Ambulatory Visit: Payer: Self-pay | Admitting: Family

## 2014-04-02 LAB — GC/CHLAMYDIA PROBE AMP
CT Probe RNA: NEGATIVE
GC PROBE AMP APTIMA: POSITIVE — AB

## 2014-04-02 LAB — RPR

## 2016-04-06 ENCOUNTER — Emergency Department (HOSPITAL_BASED_OUTPATIENT_CLINIC_OR_DEPARTMENT_OTHER): Payer: No Typology Code available for payment source

## 2016-04-06 ENCOUNTER — Emergency Department (HOSPITAL_BASED_OUTPATIENT_CLINIC_OR_DEPARTMENT_OTHER)
Admission: EM | Admit: 2016-04-06 | Discharge: 2016-04-06 | Disposition: A | Discharge status: Home / Self Care | Payer: No Typology Code available for payment source | Attending: Emergency Medicine | Admitting: Emergency Medicine

## 2016-04-06 ENCOUNTER — Encounter (HOSPITAL_BASED_OUTPATIENT_CLINIC_OR_DEPARTMENT_OTHER): Payer: Self-pay | Admitting: *Deleted

## 2016-04-06 DIAGNOSIS — Y92410 Unspecified street and highway as the place of occurrence of the external cause: Secondary | ICD-10-CM | POA: Diagnosis not present

## 2016-04-06 DIAGNOSIS — Y999 Unspecified external cause status: Secondary | ICD-10-CM | POA: Diagnosis not present

## 2016-04-06 DIAGNOSIS — F329 Major depressive disorder, single episode, unspecified: Secondary | ICD-10-CM | POA: Diagnosis not present

## 2016-04-06 DIAGNOSIS — M545 Low back pain: Secondary | ICD-10-CM | POA: Diagnosis present

## 2016-04-06 DIAGNOSIS — S3992XA Unspecified injury of lower back, initial encounter: Secondary | ICD-10-CM | POA: Diagnosis not present

## 2016-04-06 DIAGNOSIS — Y939 Activity, unspecified: Secondary | ICD-10-CM | POA: Diagnosis not present

## 2016-04-06 MED ORDER — ETODOLAC 300 MG PO CAPS: 300.0000 mg | ORAL_CAPSULE | Freq: Three times a day (TID) | ORAL | Status: DC

## 2016-04-06 MED FILL — ETODOLAC 300 MG CAPSULE: 300 | 7 days supply | Qty: 21 | Fill #0
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
## 2016-04-06 NOTE — Discharge Instructions (Signed)
Apply ice intermittently to help with the swelling and pain.  Take the medications as prescribed to help with the pain.  Follow up with a primary care doctor or sports medicine doctor to discuss further treatment if the symptoms persist.
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
## 2017-03-30 ENCOUNTER — Telehealth: Payer: Self-pay | Admitting: General Practice

## 2017-03-30 NOTE — Telephone Encounter (Signed)
I have not seen this patient in 4 years so he is considered a new patient. He is welcome to schedule an appointment to re-establish and I am happy to address any questions/concerns he may have at that time. If he feels he needs to be seen sooner he should go to an urgent care.

## 2017-03-30 NOTE — Telephone Encounter (Signed)
Spoke with pt. He states he only wants to speak with Provider directly about his question. Advised pt that PCP sees patients all morning and will be leaving directly after that and may not be able to call him directly. Generated mychart code and gave it to pt to activate mychart account and then can send message directly to PCP.

## 2017-03-30 NOTE — Telephone Encounter (Signed)
Relation to ZO:XWRUpt:self Call back number:[phone]   Reason for call:  Patient last seen 12/05/12 and would like to discuss medication prescribed, advised patient he would need to re establish with NP he voice understanding stating he would like to hear from NP first before scheduling,please advise

## 2017-03-31 NOTE — Telephone Encounter (Signed)
Notified pt of below. He states he is "having an outbreak" and was wanting to know if we could send Rx to pharmacy. Reiterated need for office visit and scheduled appt for Friday at 10:45am.

## 2017-04-01 ENCOUNTER — Telehealth: Payer: Self-pay | Admitting: Behavioral Health

## 2017-04-01 ENCOUNTER — Encounter: Payer: Self-pay | Admitting: Behavioral Health

## 2017-04-01 NOTE — Telephone Encounter (Signed)
Pre-Visit Call completed with patient and chart updated.   Pre-Visit Info documented in Specialty Comments under Snap[employer].    

## 2017-04-02 ENCOUNTER — Ambulatory Visit (INDEPENDENT_AMBULATORY_CARE_PROVIDER_SITE_OTHER): Payer: PRIVATE HEALTH INSURANCE | Admitting: Family

## 2017-04-02 ENCOUNTER — Encounter: Payer: Self-pay | Admitting: Family

## 2017-04-02 VITALS — BP 118/72 | HR 57 | Temp 98.6°F | Resp 16 | Ht 70.0 in | Wt 188.8 lb

## 2017-04-02 DIAGNOSIS — Z0000 Encounter for general adult medical examination without abnormal findings: Secondary | ICD-10-CM

## 2017-04-02 DIAGNOSIS — A6000 Herpesviral infection of urogenital system, unspecified: Secondary | ICD-10-CM

## 2017-04-02 LAB — BASIC METABOLIC PANEL
BUN: 11 mg/dL (ref 6–23)
CALCIUM: 9.9 mg/dL (ref 8.4–10.5)
CO2: 30 mEq/L (ref 19–32)
Chloride: 104 mEq/L (ref 96–112)
Creatinine, Ser: 1.35 mg/dL (ref 0.40–1.50)
GFR: 73.08 mL/min (ref >60.00)
Glucose, Bld: 111 mg/dL — ABNORMAL HIGH (ref 70–99)
POTASSIUM: 4.1 meq/L (ref 3.5–5.1)
Sodium: 140 mEq/L (ref 135–145)

## 2017-04-02 LAB — CBC WITH DIFFERENTIAL/PLATELET
BASOS ABS: 0 10*3/uL (ref 0.0–0.1)
Basophils Relative: 0.8 % (ref 0.0–3.0)
EOS PCT: 4.9 % (ref 0.0–5.0)
Eosinophils Absolute: 0.3 10*3/uL (ref 0.0–0.7)
HCT: 43.3 % (ref 39.0–52.0)
HEMOGLOBIN: 14.6 g/dL (ref 13.0–17.0)
Lymphocytes Relative: 34.1 % (ref 12.0–46.0)
Lymphs Abs: 2.1 10*3/uL (ref 0.7–4.0)
MCHC: 33.7 g/dL (ref 30.0–36.0)
MCV: 92.4 fl (ref 78.0–100.0)
MONOS PCT: 6.8 % (ref 3.0–12.0)
Monocytes Absolute: 0.4 10*3/uL (ref 0.1–1.0)
NEUTROS PCT: 53.4 % (ref 43.0–77.0)
Neutro Abs: 3.2 10*3/uL (ref 1.4–7.7)
Platelets: 231 10*3/uL (ref 150.0–400.0)
RBC: 4.68 Mil/uL (ref 4.22–5.81)
RDW: 13.3 % (ref 11.5–15.5)
WBC: 6.1 10*3/uL (ref 4.0–10.5)

## 2017-04-02 LAB — URINALYSIS, ROUTINE W REFLEX MICROSCOPIC
Bilirubin Urine: NEGATIVE
KETONES UR: NEGATIVE
Leukocytes, UA: NEGATIVE
Nitrite: NEGATIVE
PH: 6 (ref 5.0–8.0)
RBC / HPF: NONE SEEN (ref 0–?)
SPECIFIC GRAVITY, URINE: 1.02 (ref 1.000–1.030)
TOTAL PROTEIN, URINE-UPE24: NEGATIVE
Urine Glucose: NEGATIVE
Urobilinogen, UA: 1 (ref 0.0–1.0)
WBC UA: NONE SEEN (ref 0–?)

## 2017-04-02 LAB — HEPATIC FUNCTION PANEL
ALBUMIN: 4.6 g/dL (ref 3.5–5.2)
ALK PHOS: 46 U/L (ref 39–117)
ALT: 9 U/L (ref 0–53)
AST: 17 U/L (ref 0–37)
Bilirubin, Direct: 0.1 mg/dL (ref 0.0–0.3)
Total Bilirubin: 0.9 mg/dL (ref 0.2–1.2)
Total Protein: 7.8 g/dL (ref 6.0–8.3)

## 2017-04-02 LAB — LIPID PANEL
CHOL/HDL RATIO: 5
Cholesterol: 241 mg/dL — ABNORMAL HIGH (ref 0–200)
HDL: 44.4 mg/dL (ref >39.00)
LDL Cholesterol: 159 mg/dL — ABNORMAL HIGH (ref 0–99)
NONHDL: 196.27
Triglycerides: 187 mg/dL — ABNORMAL HIGH (ref 0.0–149.0)
VLDL: 37.4 mg/dL (ref 0.0–40.0)

## 2017-04-02 LAB — PSA: PSA: 0.59 ng/mL (ref 0.10–4.00)

## 2017-04-02 LAB — TSH: TSH: 5.97 u[IU]/mL — ABNORMAL HIGH (ref 0.35–4.50)

## 2017-04-02 MED ORDER — VALACYCLOVIR HCL 500 MG PO TABS: 500.0000 mg | ORAL_TABLET | Freq: Two times a day (BID) | ORAL | 1 refills | Status: DC

## 2017-04-02 NOTE — Progress Notes (Signed)
Subjective:    Patient ID: George Castro, male    DOB: 07/19/1971, 46 y.o.   MRN: [id number]  HPI  George Castro is a 46 yr old male who presents today to re-establish care. He was last seen in 2/14.    Immunizations: tetanus up to date Diet: needs improvement Exercise: exercising 2-3 times a week.  Vision: up to date Dental: up to date Declines HIV screening.  HSV- reports that he had a breakout x 2 in the last 2 months.   Reports + rhinorrhea.  Allergies have been acting up,  Some improvement with otc antihistamine  Review of Systems  Constitutional:       Had gained weight but now is losing. Was up to 200lbs  HENT: Positive for rhinorrhea. Negative for hearing loss.   Eyes: Negative for visual disturbance.  Respiratory: Negative for cough and shortness of breath.   Cardiovascular: Negative for chest pain and leg swelling.  Gastrointestinal: Negative for blood in stool, constipation and diarrhea.  Genitourinary: Negative for hematuria.  Musculoskeletal: Negative for arthralgias and myalgias.  Skin: Negative for rash.  Neurological: Negative for headaches.  Hematological: Negative for adenopathy.  Psychiatric/Behavioral:       Denies depression/anxiety   Past Medical History:  Diagnosis Date  . Allergy    seasonal, dust  . 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 Hypothyroid 08/25/2012  . Kidney stones    x 4 times     Social History   Social History  . Marital status: Divorced    Spouse name: N/A  . Number of children: 8  . Years of education: N/A   Occupational History  . Not on file.   Social History Main Topics  . Smoking status: Never Smoker  . Smokeless tobacco: Never Used  . Alcohol use Yes     Comment: occasionally pt drinks  . Drug use: No  . Sexual activity: Yes    Partners: Female   Other Topics Concern  . Not on file   Social History Narrative   Regular exercise: daily to the gym   Caffeine use: daily   Works in Forensic scientist for medical supplies   8 children- all grown   8 grandchildren          Past Surgical History:  Procedure Laterality Date  . LUMBAR SPINE SURGERY     L-4 and L-5    Family History  Problem Relation Age of Onset  . Hyperlipidemia Maternal Grandmother   . Arthritis Maternal Grandmother   . Alcohol abuse Paternal Grandfather   . Diabetes Neg Hx     Allergies  Allergen Reactions  . Peanuts [Peanut Oil] Hives  . Shrimp [Shellfish Allergy] Hives  . Penicillins Hives    Current Outpatient Prescriptions on File Prior to Visit  Medication Sig Dispense Refill  . EPINEPHrine (EPI-PEN) 0.3 mg/0.3 mL DEVI Inject 0.3mg  into thigh muscle once in case of severe allergic reaction, then go to ER 2 Device 0   No current facility-administered medications on file prior to visit.     BP 118/72 (BP Location: Right Arm, Cuff Size: Large)   Pulse (!) 57   Temp 98.6 F (37 C) (Oral)   Resp 16   Ht 5\' 10"  (1.778 m) Comment: with shoes  Wt 188 lb 12.8 oz (85.6 kg)   SpO2 97% Comment: room air  BMI 27.09 kg/m       Objective:   Physical Exam  Physical Exam  Constitutional: He is oriented to person, place, and time. He appears well-developed and well-nourished. No distress.  HENT:  Head: Normocephalic and atraumatic.  Right Ear: Tympanic membrane and ear canal normal.  Left Ear: Tympanic membrane and ear canal normal.  Mouth/Throat: Oropharynx is clear and moist.  Eyes: Pupils are equal, round, and reactive to light. No scleral icterus.  Neck: Normal range of motion. No thyromegaly present.  Cardiovascular: Normal rate and regular rhythm.   No murmur heard. Pulmonary/Chest: Effort normal and breath sounds normal. No respiratory distress. He has no wheezes. He has no rales. He exhibits no tenderness.  Abdominal: Soft. Bowel sounds are normal. He exhibits no distension and no mass. There is no tenderness. There is no rebound and no guarding.  Musculoskeletal: He exhibits no  edema.  Lymphadenopathy:    He has no cervical adenopathy.  Neurological: He is alert and oriented to person, place, and time. He has normal patellar reflexes. He exhibits normal muscle tone. Coordination normal.  Skin: Skin is warm and dry.  Psychiatric: He has a normal mood and affect. His behavior is normal. Judgment and thought content normal.           Assessment & Plan:   Preventative Care- discussed healthy diet, exercise and modest weight loss.  We discussed goal weight of 175. Obtain routine lab work including PSA.    HSV2- having recurrent breakouts.  Interested in suppressive therapy. Begin Valtrex 500mg  once daily.         Assessment & Plan:

## 2017-04-02 NOTE — Patient Instructions (Addendum)
Please complete lab work prior to leaving. Continue your work on healthy diet, exercise and weight loss. Begin one daily valtrex.

## 2017-04-15 ENCOUNTER — Telehealth: Payer: Self-pay | Admitting: Family

## 2017-04-15 DIAGNOSIS — E039 Hypothyroidism, unspecified: Secondary | ICD-10-CM

## 2017-04-15 DIAGNOSIS — R739 Hyperglycemia, unspecified: Secondary | ICD-10-CM

## 2017-04-15 NOTE — Telephone Encounter (Signed)
Please let pt know that lab work shows underactive thyroid.  I would like him to return to the lab for free t3 and free t4.  Sugar is mildly elevated.  Please add A1C.  Dx hyperglycemia. Cholesterol is elevated.  Work on low fat/low cholesterol diet and exercise and avoid concentrated sweets.

## 2017-04-16 NOTE — Telephone Encounter (Signed)
Notified pt. Lab appointment scheduled 04/22/17

## 2017-04-22 ENCOUNTER — Other Ambulatory Visit (INDEPENDENT_AMBULATORY_CARE_PROVIDER_SITE_OTHER): Payer: PRIVATE HEALTH INSURANCE

## 2017-04-22 ENCOUNTER — Telehealth: Payer: Self-pay | Admitting: *Deleted

## 2017-04-22 DIAGNOSIS — E039 Hypothyroidism, unspecified: Secondary | ICD-10-CM | POA: Diagnosis not present

## 2017-04-22 DIAGNOSIS — R739 Hyperglycemia, unspecified: Secondary | ICD-10-CM

## 2017-04-22 LAB — T3, FREE: T3 FREE: 3.1 pg/mL (ref 2.3–4.2)

## 2017-04-22 LAB — T4, FREE: FREE T4: 0.63 ng/dL (ref 0.60–1.60)

## 2017-04-22 NOTE — Telephone Encounter (Signed)
Pt came in today to do a1c due to hyperglycemia and told the front office:  He will probably always show higher sugar levels because he adds some sugar to everything he eats. He just finished his blood work. He uses some artificial sweeteners too.   Please advise once lab results are final?

## 2017-04-23 LAB — HEMOGLOBIN A1C
HEMOGLOBIN A1C: 5.5 % (ref <5.7)
Mean Plasma Glucose: 111 mg/dL

## 2017-04-25 ENCOUNTER — Telehealth: Payer: Self-pay | Admitting: Family

## 2017-04-25 DIAGNOSIS — E039 Hypothyroidism, unspecified: Secondary | ICD-10-CM

## 2017-04-25 NOTE — Telephone Encounter (Signed)
Lab work shows borderline hypothyroid. Is he having fatigue?  If so, I would recommend starting thyroid medication. (synthroid once daily and repeat tsh in 6 weeks dx hypothyroid). If not, then I would recommend repeating tsh free t3 and free t4 in 6 weeks.

## 2017-04-26 NOTE — Telephone Encounter (Signed)
Left message for pt to return my call.

## 2017-04-27 MED ORDER — LEVOTHYROXINE SODIUM 25 MCG PO TABS: 25.0000 ug | ORAL_TABLET | Freq: Every day | ORAL | 1 refills | Status: DC

## 2017-04-27 NOTE — Telephone Encounter (Signed)
Notified pt of below results and he voices understanding. Reports that he has been more tired recently but thought he just wasn't getting enough sleep. Pt would like to start medication. Lab appt has been scheduled for 06/08/17 at 9am and future lab order has been entered.

## 2017-06-08 ENCOUNTER — Other Ambulatory Visit: Payer: PRIVATE HEALTH INSURANCE

## 2017-07-25 ENCOUNTER — Other Ambulatory Visit: Payer: Self-pay | Admitting: Family

## 2018-01-26 ENCOUNTER — Ambulatory Visit: Payer: PRIVATE HEALTH INSURANCE | Admitting: Family

## 2018-02-02 ENCOUNTER — Encounter: Payer: Self-pay | Admitting: Family

## 2018-02-02 ENCOUNTER — Ambulatory Visit (INDEPENDENT_AMBULATORY_CARE_PROVIDER_SITE_OTHER): Payer: 59 | Admitting: Family

## 2018-02-02 ENCOUNTER — Telehealth: Payer: Self-pay | Admitting: Family

## 2018-02-02 VITALS — BP 125/68 | HR 66 | Temp 98.0°F | Resp 16 | Ht 69.5 in | Wt 201.0 lb

## 2018-02-02 DIAGNOSIS — R55 Syncope and collapse: Secondary | ICD-10-CM

## 2018-02-02 DIAGNOSIS — R5383 Other fatigue: Secondary | ICD-10-CM

## 2018-02-02 DIAGNOSIS — E039 Hypothyroidism, unspecified: Secondary | ICD-10-CM

## 2018-02-02 DIAGNOSIS — R739 Hyperglycemia, unspecified: Secondary | ICD-10-CM | POA: Diagnosis not present

## 2018-02-02 LAB — CBC WITH DIFFERENTIAL/PLATELET
Basophils Absolute: 0.1 10*3/uL (ref 0.0–0.1)
Basophils Relative: 0.9 % (ref 0.0–3.0)
EOS PCT: 2.5 % (ref 0.0–5.0)
Eosinophils Absolute: 0.2 10*3/uL (ref 0.0–0.7)
HCT: 44 % (ref 39.0–52.0)
Hemoglobin: 14.9 g/dL (ref 13.0–17.0)
LYMPHS PCT: 23 % (ref 12.0–46.0)
Lymphs Abs: 1.6 10*3/uL (ref 0.7–4.0)
MCHC: 33.7 g/dL (ref 30.0–36.0)
MCV: 93.7 fl (ref 78.0–100.0)
MONOS PCT: 7.3 % (ref 3.0–12.0)
Monocytes Absolute: 0.5 10*3/uL (ref 0.1–1.0)
NEUTROS ABS: 4.6 10*3/uL (ref 1.4–7.7)
NEUTROS PCT: 66.3 % (ref 43.0–77.0)
PLATELETS: 249 10*3/uL (ref 150.0–400.0)
RBC: 4.7 Mil/uL (ref 4.22–5.81)
RDW: 13.7 % (ref 11.5–15.5)
WBC: 7 10*3/uL (ref 4.0–10.5)

## 2018-02-02 LAB — BASIC METABOLIC PANEL
BUN: 13 mg/dL (ref 6–23)
CHLORIDE: 103 meq/L (ref 96–112)
CO2: 29 meq/L (ref 19–32)
Calcium: 9 mg/dL (ref 8.4–10.5)
Creatinine, Ser: 1.25 mg/dL (ref 0.40–1.50)
GFR: 79.57 mL/min (ref >60.00)
GLUCOSE: 128 mg/dL — AB (ref 70–99)
POTASSIUM: 3.8 meq/L (ref 3.5–5.1)
SODIUM: 139 meq/L (ref 135–145)

## 2018-02-02 LAB — HEPATIC FUNCTION PANEL
ALBUMIN: 4.1 g/dL (ref 3.5–5.2)
ALT: 12 U/L (ref 0–53)
AST: 21 U/L (ref 0–37)
Alkaline Phosphatase: 52 U/L (ref 39–117)
Bilirubin, Direct: 0.1 mg/dL (ref 0.0–0.3)
TOTAL PROTEIN: 7.8 g/dL (ref 6.0–8.3)
Total Bilirubin: 0.7 mg/dL (ref 0.2–1.2)

## 2018-02-02 LAB — URINALYSIS, ROUTINE W REFLEX MICROSCOPIC
BILIRUBIN URINE: NEGATIVE
Ketones, ur: NEGATIVE
LEUKOCYTES UA: NEGATIVE
Nitrite: NEGATIVE
PH: 6 (ref 5.0–8.0)
SPECIFIC GRAVITY, URINE: 1.02 (ref 1.000–1.030)
TOTAL PROTEIN, URINE-UPE24: NEGATIVE
UROBILINOGEN UA: 0.2 (ref 0.0–1.0)
Urine Glucose: NEGATIVE
WBC, UA: NONE SEEN (ref 0–?)

## 2018-02-02 LAB — TSH: TSH: 8.89 u[IU]/mL — ABNORMAL HIGH (ref 0.35–4.50)

## 2018-02-02 MED ORDER — LEVOTHYROXINE SODIUM 50 MCG PO TABS: 50.0000 ug | ORAL_TABLET | Freq: Every day | ORAL | 3 refills | Status: DC

## 2018-02-02 NOTE — Telephone Encounter (Signed)
Please contact pt and let him know that his lab work shows that his thyroid medication needs to be increased.  Please increase synthroid form 25mcg to 50mcg once daily. Repeat TSH in 4-6 weeks.  Sugar mildly elevated.  Please ask lab to add on A1C. Dx hyperglycemia.    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  Please fax clearance letter (will write this afternoon) to Livingston DionesBeverly Patterson FNP [phone].
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
## 2018-02-02 NOTE — Patient Instructions (Signed)
Please complete lab work prior to leaving.  Let us know if you have recurrent symptoms of fatigue.

## 2018-02-03 ENCOUNTER — Other Ambulatory Visit (INDEPENDENT_AMBULATORY_CARE_PROVIDER_SITE_OTHER): Payer: 59

## 2018-02-03 ENCOUNTER — Telehealth: Payer: Self-pay | Admitting: Family

## 2018-02-03 DIAGNOSIS — R739 Hyperglycemia, unspecified: Secondary | ICD-10-CM | POA: Diagnosis not present

## 2018-02-03 LAB — HEMOGLOBIN A1C: Hgb A1c MFr Bld: 5.8 % (ref 4.6–6.5)

## 2018-02-03 NOTE — Telephone Encounter (Signed)
Add on request faxed to pharmacy and future lab order has been placed. See below lab results.

## 2018-02-03 NOTE — Telephone Encounter (Signed)
Letter faxed to below #. Left message for pt to return my call.

## 2018-02-03 NOTE — Telephone Encounter (Signed)
Copied from CRM [id number]. Topic: Inquiry >> Feb 03, 2018  9:01 AM Windy KalataMichael, Valentina Alcoser L, NT wrote: Jeanice LimHolly is a NP with [facility] and states she received a letter in regards to this patient and would like to speak to [facility]. I informed her that Efraim Kaufmannmelissa was out of the office today. She would like melissa to call her when she returns tomorrow. Please advise.  [phone]  [facility]

## 2018-02-03 NOTE — Telephone Encounter (Signed)
Pt notified and voices understanding. Lab appt scheduled for 03/18/18 at 10am.

## 2018-02-04 ENCOUNTER — Telehealth: Payer: Self-pay | Admitting: Family

## 2018-02-04 NOTE — Telephone Encounter (Signed)
Copied from CRM [phone]. Topic: Quick Communication - See Telephone Encounter >> Feb 04, 2018 10:46 AM Louie BunPalacios Medina, Rosey Batheresa D wrote: CRM for notification. See Telephone encounter for: 02/04/18. Patient called and would like to talk to [facility] or her CMA about his last visit and test results that was sent to [facility]. Please call patient back, thanks.

## 2018-02-04 NOTE — Telephone Encounter (Signed)
Please contact pt and let him know that [facility] and she has my medical clearance letter.  He should be able to print it off of mychart if he would like to give a copy to his employer.

## 2018-02-04 NOTE — Telephone Encounter (Signed)
Patient notified and verbalized understanding. 

## 2018-02-04 NOTE — Telephone Encounter (Signed)
[facility], she was unavailable but I left my cell number for her to call me back.

## 2018-02-09 NOTE — Telephone Encounter (Signed)
LM for patient to call back.

## 2018-02-10 NOTE — Telephone Encounter (Signed)
Was able to speak to patient, he said he already saw his results on MyChart and he does not have any questions at this time.

## 2018-02-21 ENCOUNTER — Telehealth: Payer: Self-pay | Admitting: Family

## 2018-02-21 NOTE — Telephone Encounter (Signed)
Copied from CRM [phone]. Topic: Quick Communication - See Telephone Encounter >> Feb 21, 2018  3:47 PM Cipriano BunkerLambe, Annette S wrote: CRM for notification.   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  When he is to return to work and limitations or restrictions.  See Telephone encounter for: 02/21/18.

## 2018-02-22 ENCOUNTER — Telehealth: Payer: Self-pay | Admitting: *Deleted

## 2018-02-22 NOTE — Telephone Encounter (Signed)
Left message for pt to return my call.  Copied from CRM [id number]. Topic: Inquiry >> Feb 22, 2018  4:12 PM Windy KalataMichael, Taylor L, NT wrote: Reason for CRM: patient is requesting Laveda AbbeJennifer, melissa Osullivan give him a call in regards to his disability.

## 2018-02-22 NOTE — Telephone Encounter (Signed)
Please ask them to send me paperwork and I will verify.  I can't give information over the phone without patient consent.

## 2018-02-22 NOTE — Telephone Encounter (Signed)
Left detailed message on George Castro's voicemail to fax consent to nurse station. Awaiting ROI consent.

## 2018-02-23 NOTE — Telephone Encounter (Signed)
Completed as much as possible from phone & office notes; forwarded to provider with attached OV note per STD Claim instructions/SLS 04/24

## 2018-02-23 NOTE — Telephone Encounter (Signed)
Pt called stating he is aware Guardian is needing additional information to process his claim through work and he gives verbal consent to release his medical information to them. Pt states he is out of town and unable to come to the office to sign a consent form. Please advise?

## 2018-02-23 NOTE — Telephone Encounter (Signed)
Patient dx was near syncope.  Last date of work was 01/20/18 and I wrote a clearance to return to work on 02/02/18.

## 2018-02-23 NOTE — Telephone Encounter (Signed)
Pt called stating he is aware Guardian is needing additional information to process his claim through work and he gives verbal consent to release his medical information to them. Pt states he is out of town and unable to come to the office to sign a consent form. See 02/21/18 phone note.

## 2018-02-28 NOTE — Telephone Encounter (Signed)
Patient advised his form was completed by Efraim Kaufmann and I faxed it to fax # [phone].

## 2018-03-18 ENCOUNTER — Other Ambulatory Visit (INDEPENDENT_AMBULATORY_CARE_PROVIDER_SITE_OTHER): Payer: 59

## 2018-03-18 ENCOUNTER — Telehealth: Payer: Self-pay | Admitting: Family

## 2018-03-18 DIAGNOSIS — E039 Hypothyroidism, unspecified: Secondary | ICD-10-CM

## 2018-03-18 LAB — TSH: TSH: 6.04 u[IU]/mL — ABNORMAL HIGH (ref 0.35–4.50)

## 2018-03-18 MED ORDER — LEVOTHYROXINE SODIUM 75 MCG PO TABS: 75.0000 ug | ORAL_TABLET | Freq: Every day | ORAL | 3 refills | Status: DC

## 2018-03-18 NOTE — Telephone Encounter (Signed)
Thyroid testing is improving but we still need to increase synthroid further.  Please increase synthroid to 75 mcg. Repeat TSH in 6 weeks.

## 2018-03-21 NOTE — Telephone Encounter (Signed)
Attempted to reach pt and received voicemail. Left message to check  mychart acct.  Message sent. 

## 2018-04-28 ENCOUNTER — Other Ambulatory Visit: Payer: Self-pay | Admitting: Family

## 2018-05-02 ENCOUNTER — Encounter: Payer: PRIVATE HEALTH INSURANCE | Admitting: Family

## 2019-02-05 ENCOUNTER — Telehealth: Payer: Self-pay | Admitting: Family

## 2019-02-05 NOTE — Telephone Encounter (Signed)
Please call pt to schedule a follow up OV for thyroid.

## 2019-02-07 ENCOUNTER — Ambulatory Visit (INDEPENDENT_AMBULATORY_CARE_PROVIDER_SITE_OTHER): Payer: 59 | Admitting: Family

## 2019-02-07 ENCOUNTER — Other Ambulatory Visit: Payer: Self-pay

## 2019-02-07 DIAGNOSIS — Z91018 Allergy to other foods: Secondary | ICD-10-CM | POA: Diagnosis not present

## 2019-02-07 DIAGNOSIS — N529 Male erectile dysfunction, unspecified: Secondary | ICD-10-CM | POA: Diagnosis not present

## 2019-02-07 DIAGNOSIS — E039 Hypothyroidism, unspecified: Secondary | ICD-10-CM

## 2019-02-07 DIAGNOSIS — J309 Allergic rhinitis, unspecified: Secondary | ICD-10-CM

## 2019-02-07 MED ORDER — EPINEPHRINE 0.3 MG/0.3ML IJ SOAJ: 0.3000 mg | INTRAMUSCULAR | 1 refills | Status: DC | PRN

## 2019-02-07 MED ORDER — MONTELUKAST SODIUM 10 MG PO TABS: 10.0000 mg | ORAL_TABLET | Freq: Every day | ORAL | 1 refills | Status: DC

## 2019-02-07 MED ORDER — LEVOTHYROXINE SODIUM 75 MCG PO TABS: 75.0000 ug | ORAL_TABLET | Freq: Every day | ORAL | 3 refills | Status: DC

## 2019-02-07 MED ORDER — VALACYCLOVIR HCL 500 MG PO TABS: ORAL_TABLET | ORAL | 1 refills | Status: DC

## 2019-02-07 MED ORDER — SILDENAFIL CITRATE 20 MG PO TABS: ORAL_TABLET | ORAL | 1 refills | Status: DC

## 2019-02-07 NOTE — Telephone Encounter (Signed)
Scheduled for today at 2pm

## 2019-02-07 NOTE — Progress Notes (Signed)
Virtual Visit via Video Note  I connected with Mr. George Castro on 02/07/19 at  2:00 PM EDT by a video enabled telemedicine application and verified that I am speaking with the correct person using two identifiers. This visit type was conducted due to national recommendations for restrictions regarding the COVID-19 Pandemic (e.g. social distancing).  This format is felt to be most appropriate for this patient at this time.   I discussed the limitations of evaluation and management by telemedicine and the availability of in person appointments. The patient expressed understanding and agreed to proceed.  Only the patient and myself were on today's video visit. The patient was in his truck and I was in my office at the time of today's visit.   History of Present Illness:  Hypothyroid- reports that he ran out of synthroid. Reports that he was unemployed for a bit.   He denies unusual fatigue.  Reports that he has been lazy and less active than he was. Has been w Reports that his last weight last week was 205  Wt Readings from Last 3 Encounters:  02/02/18 201 lb (91.2 kg)  04/02/17 188 lb 12.8 oz (85.6 kg)  04/06/16 180 lb (81.6 kg)    Lab Results  Component Value Date   TSH 6.04 (H) 03/18/2018   ED- reports that it has been an issue for him for the last 2 years. Reports that he is able to obtain an erection but only some of the time. Reports that this is really starting to be a problem in his relationship.   Allergic rhinitis- reports that he purchased otc flonase and claritin. Still having a lot of nasal congestion despite use of these meds.     Observations/Objective:  Gen: Awake, alert, no acute distress Resp: Breathing is even and non-labored Psych: calm/pleasant demeanor Neuro: Alert and Oriented x 3, + facial symmetry, speech is clear.   Assessment and Plan: Hypothyroid- restart synthroid, plan repeat TSH in 4-6 weeks. He has had some weight gain which may be secondary to his  thyroid.   Allergic rhinitis-uncontrolled. Will add a trial of singulair.  ED- new. Will give trial of generic sildenafil.   Food allergies- refilled his epipen.  Follow Up Instructions:    I discussed the assessment and treatment plan with the patient. The patient was provided an opportunity to ask questions and all were answered. The patient agreed with the plan and demonstrated an understanding of the instructions.   The patient was advised to call back or seek an in-person evaluation if the symptoms worsen or if the condition fails to improve as anticipated.    Lemont Fillers, NP

## 2019-05-31 ENCOUNTER — Other Ambulatory Visit: Payer: Self-pay

## 2019-05-31 ENCOUNTER — Telehealth: Payer: Self-pay | Admitting: Family

## 2019-05-31 DIAGNOSIS — N529 Male erectile dysfunction, unspecified: Secondary | ICD-10-CM

## 2019-05-31 MED ORDER — SILDENAFIL CITRATE 20 MG PO TABS: ORAL_TABLET | ORAL | 1 refills | Status: DC

## 2019-05-31 NOTE — Telephone Encounter (Signed)
Patient was scheduled for virtual visit for Friday 06-01-00 at his request for evaluation of past near syncope episode and possible referral to Neurology

## 2019-05-31 NOTE — Telephone Encounter (Signed)
Patient advised of refill, he will like to be referred to urology. Referral entered for [facility],.

## 2019-05-31 NOTE — Telephone Encounter (Signed)
Pt called and is requesting a refill be sent in for his ED medication and for his levothyroxine. Pt is requesting that his ED medication be increased as he said it is not working for him. Please advise.   [facility] [place], [address]  [place] [address]  Phone: [phone] Fax: [phone]  Not a 24 hour pharmacy; exact hours not known.

## 2019-05-31 NOTE — Progress Notes (Unsigned)
mbu 

## 2019-05-31 NOTE — Telephone Encounter (Signed)
Refill sent.  I would not recommend increasing dose higher than 2 tablets of sildenafil. Would he like a referral to Urology to discuss other treatment options?

## 2019-06-02 ENCOUNTER — Ambulatory Visit: Payer: 59 | Admitting: Family

## 2019-06-02 DIAGNOSIS — Z0289 Encounter for other administrative examinations: Secondary | ICD-10-CM

## 2019-06-06 ENCOUNTER — Telehealth: Payer: Self-pay

## 2019-06-06 NOTE — Telephone Encounter (Signed)
PA initiated via Covermymeds; KEY: [id number]. Awaiting determination.

## 2019-06-09 ENCOUNTER — Ambulatory Visit: Payer: 59 | Admitting: Family

## 2019-06-09 NOTE — Telephone Encounter (Signed)
PA denied. Medication only covered for dx of pulmonary arterial hypertension.

## 2019-06-26 ENCOUNTER — Encounter: Payer: Self-pay | Admitting: Family

## 2019-07-12 ENCOUNTER — Other Ambulatory Visit: Payer: Self-pay | Admitting: Family

## 2019-07-20 ENCOUNTER — Other Ambulatory Visit: Payer: Self-pay | Admitting: *Deleted

## 2019-07-20 MED ORDER — VALACYCLOVIR HCL 500 MG PO TABS: ORAL_TABLET | ORAL | 1 refills | Status: DC

## 2019-08-22 ENCOUNTER — Other Ambulatory Visit: Payer: Self-pay | Admitting: Family

## 2019-08-22 MED ORDER — SILDENAFIL CITRATE 20 MG PO TABS: ORAL_TABLET | ORAL | 0 refills | Status: DC

## 2019-08-22 MED ORDER — VALACYCLOVIR HCL 500 MG PO TABS: ORAL_TABLET | ORAL | 1 refills | Status: DC

## 2019-08-22 MED ORDER — LEVOTHYROXINE SODIUM 75 MCG PO TABS: 75.0000 ug | ORAL_TABLET | Freq: Every day | ORAL | 3 refills | Status: DC

## 2019-08-22 NOTE — Telephone Encounter (Signed)
Medication Refill - Medication: sildenafil (REVATIO) 20 MG tablet levothyroxine (SYNTHROID, LEVOTHROID) 75 MCG tablet valACYclovir (VALTREX) 500 MG tablet  Has the patient contacted their pharmacy? Yes.   (Agent: If no, request that the patient contact the pharmacy for the refill.) (Agent: If yes, when and what did the pharmacy advise?)  Preferred Pharmacy (with phone number or street name):  [place], [place] - [address] [address]  Phone: [phone] Fax: [phone]     Agent: Please be advised that RX refills may take up to 3 business days. We ask that you follow-up with your pharmacy.

## 2019-08-22 NOTE — Telephone Encounter (Signed)
Medication went to a different pharmacy

## 2019-08-23 ENCOUNTER — Other Ambulatory Visit: Payer: Self-pay

## 2019-08-23 ENCOUNTER — Ambulatory Visit (INDEPENDENT_AMBULATORY_CARE_PROVIDER_SITE_OTHER): Payer: 59 | Admitting: Family

## 2019-08-23 ENCOUNTER — Encounter: Payer: Self-pay | Admitting: Family

## 2019-08-23 DIAGNOSIS — B009 Herpesviral infection, unspecified: Secondary | ICD-10-CM | POA: Diagnosis not present

## 2019-08-23 DIAGNOSIS — N529 Male erectile dysfunction, unspecified: Secondary | ICD-10-CM | POA: Diagnosis not present

## 2019-08-23 DIAGNOSIS — E039 Hypothyroidism, unspecified: Secondary | ICD-10-CM

## 2019-08-23 DIAGNOSIS — Z91018 Allergy to other foods: Secondary | ICD-10-CM | POA: Diagnosis not present

## 2019-08-23 DIAGNOSIS — J309 Allergic rhinitis, unspecified: Secondary | ICD-10-CM

## 2019-08-23 MED ORDER — SILDENAFIL CITRATE 20 MG PO TABS: ORAL_TABLET | ORAL | 0 refills | Status: DC

## 2019-08-23 MED ORDER — LEVOTHYROXINE SODIUM 75 MCG PO TABS: 75.0000 ug | ORAL_TABLET | Freq: Every day | ORAL | 1 refills | Status: DC

## 2019-08-23 NOTE — Telephone Encounter (Signed)
Talked to patient rx is ok, he will call if he needs anything else

## 2019-08-23 NOTE — Progress Notes (Signed)
Virtual Visit via Video Note  I attempted to connect with George Castro on 08/23/19 at 11:20 AM EDT by a video enabled telemedicine application and verified that I am speaking with the correct person using two identifiers. Due to technical difficulties the visit was transitioned to a telephone visit.   Location: Patient: home Provider: work   I discussed the limitations of evaluation and management by telemedicine and the availability of in person appointments. The patient expressed understanding and agreed to proceed.  History of Present Illness:  Patient is a 48 yr old male who presents today for follow up.  Food allergies- has an epipen to use as needed. Has not had any recent issues with allergic reactions.  Hypothyroid- reports that energy is good on current dose of synthroid. Lab Results  Component Value Date   TSH 6.04 (H) 03/18/2018   ED- has rx for prn revatio, notes some improvement with ED, but still having issues. Would like a referral to urology.  Hx of HSV2- continues valtrex bid.   Allergic rhinitis-  Reports that singulair helps.  Makes him sleepy, doesn't take when he is working.  Past Medical History:  Diagnosis Date  . Allergy    seasonal, dust  . 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 Hypothyroid 08/25/2012  . Kidney stones    x 4 times     Social History   Socioeconomic History  . Marital status: Divorced    Spouse name: Not on file  . Number of children: 8  . Years of education: Not on file  . Highest education level: Not on file  Occupational History  . Not on file  Social Needs  . Financial resource strain: Not on file  . Food insecurity    Worry: Not on file    Inability: Not on file  . Transportation needs    Medical: Not on file    Non-medical: Not on file  Tobacco Use  . Smoking status: Never Smoker  . Smokeless tobacco: Never Used  Substance and Sexual Activity  . Alcohol use: Yes    Comment: occasionally pt  drinks  . Drug use: No  . Sexual activity: Yes    Partners: Female  Lifestyle  . Physical activity    Days per week: Not on file    Minutes per session: Not on file  . Stress: Not on file  Relationships  . Social Herbalist on phone: Not on file    Gets together: Not on file    Attends religious service: Not on file    Active member of club or organization: Not on file    Attends meetings of clubs or organizations: Not on file    Relationship status: Not on file  . Intimate partner violence    Fear of current or ex partner: Not on file    Emotionally abused: Not on file    Physically abused: Not on file    Forced sexual activity: Not on file  Other Topics Concern  . Not on file  Social History Narrative   Regular exercise: daily to the gym   Caffeine use: daily   Works as Administrator   Has SW degree from [employer]   8 children- all grown   10 grandchildren   Lives alone, single.     Past Surgical History:  Procedure Laterality Date  . LUMBAR SPINE SURGERY     L-4 and L-5    Family History  Problem Relation Age of Onset  . Hyperlipidemia Maternal Grandmother   . Arthritis Maternal Grandmother   . Alcohol abuse Paternal Grandfather   . Diabetes Neg Hx     Allergies  Allergen Reactions  . Peanuts [Peanut Oil] Hives  . Shrimp [Shellfish Allergy] Hives  . Penicillins Hives    Current Outpatient Medications on File Prior to Visit  Medication Sig Dispense Refill  . EPINEPHrine (EPIPEN 2-PAK) 0.3 mg/0.3 mL IJ SOAJ injection Inject 0.3 mLs (0.3 mg total) into the muscle as needed for anaphylaxis. 2 Device 1  . montelukast (SINGULAIR) 10 MG tablet Take 1 tablet (10 mg total) by mouth at bedtime. 90 tablet 1  . valACYclovir (VALTREX) 500 MG tablet TAKE 1 TABLET(500 MG) BY MOUTH TWICE DAILY 180 tablet 1   No current facility-administered medications on file prior to visit.     There were no vitals taken for this visit.      Observations/Objective:   Gen: Awake, alert, no acute distress Resp: Breathing is even and non-labored Psych: calm/pleasant demeanor Neuro: Alert and Oriented x 3, + facial symmetry, speech is clear.   Assessment and Plan:  Hypothyroid- clinically stable on synthroid. Obtain follow up TSH.   ED- suboptimal response with sildenafil. Will refer to urology for further evaluation.  Food allergies- stable, has epipen on hand for emergencies.   HSV2- stable on valtrex.   Allergic rhinitis- stable with prn use of singulair, continue same.   Recommended that patient get a flu shot at his local pharmacy.    15 minutes spent on today's phone visit.   Follow Up Instructions:    I discussed the assessment and treatment plan with the patient. The patient was provided an opportunity to ask questions and all were answered. The patient agreed with the plan and demonstrated an understanding of the instructions.   The patient was advised to call back or seek an in-person evaluation if the symptoms worsen or if the condition fails to improve as anticipated.  Lemont Fillers, NP

## 2019-09-13 ENCOUNTER — Encounter: Payer: Self-pay | Admitting: Family

## 2019-09-13 ENCOUNTER — Other Ambulatory Visit: Payer: Self-pay

## 2019-09-13 ENCOUNTER — Other Ambulatory Visit (INDEPENDENT_AMBULATORY_CARE_PROVIDER_SITE_OTHER): Payer: 59

## 2019-09-13 ENCOUNTER — Telehealth: Payer: Self-pay | Admitting: Family

## 2019-09-13 DIAGNOSIS — E039 Hypothyroidism, unspecified: Secondary | ICD-10-CM | POA: Diagnosis not present

## 2019-09-13 LAB — TSH: TSH: 0.48 u[IU]/mL (ref 0.35–4.50)

## 2019-09-13 NOTE — Telephone Encounter (Signed)
Please contact pt to schedule a face to face visit with me for fatigue.

## 2019-09-13 NOTE — Telephone Encounter (Signed)
-----   Message from Caffie Pinto sent at 09/13/2019  7:24 AM EST ----- Regarding: LAB Patient had TSH drawn this morning, he asked me to pass a message that the past 2 weeks he has been overly tired and fatigued. 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

## 2019-09-13 NOTE — Telephone Encounter (Signed)
Left detailed message on machine for him to call us back to schedule in person visit for fatigue.

## 2019-09-22 ENCOUNTER — Ambulatory Visit (INDEPENDENT_AMBULATORY_CARE_PROVIDER_SITE_OTHER): Payer: 59 | Admitting: Family

## 2019-09-22 ENCOUNTER — Other Ambulatory Visit: Payer: Self-pay

## 2019-09-22 VITALS — BP 125/80 | HR 62 | Temp 97.0°F | Resp 16 | Ht 70.0 in | Wt 198.0 lb

## 2019-09-22 DIAGNOSIS — E039 Hypothyroidism, unspecified: Secondary | ICD-10-CM

## 2019-09-22 DIAGNOSIS — J309 Allergic rhinitis, unspecified: Secondary | ICD-10-CM | POA: Diagnosis not present

## 2019-09-22 DIAGNOSIS — N529 Male erectile dysfunction, unspecified: Secondary | ICD-10-CM

## 2019-09-22 DIAGNOSIS — R5383 Other fatigue: Secondary | ICD-10-CM

## 2019-09-22 NOTE — Progress Notes (Signed)
Subjective:    Patient ID: George Castro, male    DOB: [birth year], 48 y.o.   MRN: [id number]  HPI  Patient is a 48 yr old male who presents today for follow up.  Hypothyroid- Reports that he has not been exercising due to being busy. Notes that he has not been eating healthy.  Reports that he drinks 5 hour energy.  This helps briefly.  Not sleeping well.  Reports that he starts work at [employer], gets home usually 12PM-3-4 PM, Usually works 6 days a week.  Tries to stay up for a few hours. He is staying with his fiance.  He has an apartment in [place].    Lab Results  Component Value Date   TSH 0.48 09/13/2019   ED- reports that he missed his urology appointment.    Fatigue- Reports increased fatigue recently.   Allergic rhinitis- reports singulair is helpful.  Review of Systems See HPI  Past Medical History:  Diagnosis Date  . Allergy    seasonal, dust  . 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 Hypothyroid 08/25/2012  . Kidney stones    x 4 times     Social History   Socioeconomic History  . Marital status: Divorced    Spouse name: Not on file  . Number of children: 8  . Years of education: Not on file  . Highest education level: Not on file  Occupational History  . Not on file  Social Needs  . Financial resource strain: Not on file  . Food insecurity    Worry: Not on file    Inability: Not on file  . Transportation needs    Medical: Not on file    Non-medical: Not on file  Tobacco Use  . Smoking status: Never Smoker  . Smokeless tobacco: Never Used  Substance and Sexual Activity  . Alcohol use: Yes    Comment: occasionally pt drinks  . Drug use: No  . Sexual activity: Yes    Partners: Female  Lifestyle  . Physical activity    Days per week: Not on file    Minutes per session: Not on file  . Stress: Not on file  Relationships  . Social Musician on phone: Not on file    Gets together: Not on file    Attends religious  service: Not on file    Active member of club or organization: Not on file    Attends meetings of clubs or organizations: Not on file    Relationship status: Not on file  . Intimate partner violence    Fear of current or ex partner: Not on file    Emotionally abused: Not on file    Physically abused: Not on file    Forced sexual activity: Not on file  Other Topics Concern  . Not on file  Social History Narrative   Regular exercise: daily to the gym   Caffeine use: daily   Works as Naval architect   Has SW degree from [employer]   8 children- all grown   10 grandchildren   Lives alone, single.     Past Surgical History:  Procedure Laterality Date  . LUMBAR SPINE SURGERY     L-4 and L-5    Family History  Problem Relation Age of Onset  . Hyperlipidemia Maternal Grandmother   . Arthritis Maternal Grandmother   . Alcohol abuse Paternal Grandfather   . Diabetes Neg Hx  Allergies  Allergen Reactions  . Peanuts [Peanut Oil] Hives  . Shrimp [Shellfish Allergy] Hives  . Penicillins Hives    Current Outpatient Medications on File Prior to Visit  Medication Sig Dispense Refill  . EPINEPHrine (EPIPEN 2-PAK) 0.3 mg/0.3 mL IJ SOAJ injection Inject 0.3 mLs (0.3 mg total) into the muscle as needed for anaphylaxis. 2 Device 1  . levothyroxine (SYNTHROID) 75 MCG tablet Take 1 tablet (75 mcg total) by mouth daily. 90 tablet 1  . sildenafil (REVATIO) 20 MG tablet take 1 to 2 tablets by mouth 30 to 60 minutes prior to sexual activity 50 tablet 0  . valACYclovir (VALTREX) 500 MG tablet TAKE 1 TABLET(500 MG) BY MOUTH TWICE DAILY 180 tablet 1   No current facility-administered medications on file prior to visit.     BP 125/80 (BP Location: Left Arm, Patient Position: Sitting, Cuff Size: Large)   Pulse 62   Temp (!) 97 F (36.1 C) (Temporal)   Resp 16   Ht 5\' 10"  (1.778 m)   Wt 198 lb (89.8 kg)   SpO2 99%   BMI 28.41 kg/m       Objective:   Physical Exam Constitutional:       General: He is not in acute distress.    Appearance: He is well-developed.  HENT:     Head: Normocephalic and atraumatic.  Cardiovascular:     Rate and Rhythm: Normal rate and regular rhythm.     Heart sounds: No murmur.  Pulmonary:     Effort: Pulmonary effort is normal. No respiratory distress.     Breath sounds: Normal breath sounds. No wheezing or rales.  Skin:    General: Skin is warm and dry.  Neurological:     Mental Status: He is alert and oriented to person, place, and time.  Psychiatric:        Behavior: Behavior normal.        Thought Content: Thought content normal.           Assessment & Plan:  ED- advised pt to call to reschedule his urology appointment.  Hypothyroid- TSH stable.   Lab Results  Component Value Date   TSH 0.48 09/13/2019   Fatigue- I think that this is due to poor sleep hygiene. We discussed measures to improve his sleep hygiene.    Allergic rhinitis- stable on singulair, continues same.

## 2019-09-22 NOTE — Patient Instructions (Signed)
Please reschedule your appointment with Urology.

## 2019-09-27 ENCOUNTER — Telehealth: Payer: Self-pay | Admitting: Family

## 2019-09-27 NOTE — Telephone Encounter (Signed)
Called pt lvm to call back for lab appt

## 2019-09-27 NOTE — Telephone Encounter (Signed)
Please contact pt to schedule lab appointment.

## 2019-10-09 NOTE — Telephone Encounter (Signed)
Please contact pt to schedule a lab visit.  

## 2019-10-09 NOTE — Telephone Encounter (Signed)
lvm for patient to call bak and schedule a lab appointment.

## 2019-10-10 NOTE — Telephone Encounter (Signed)
Patient will be here tomorrow for labs 

## 2019-10-11 ENCOUNTER — Other Ambulatory Visit: Payer: Self-pay

## 2019-10-11 ENCOUNTER — Other Ambulatory Visit (INDEPENDENT_AMBULATORY_CARE_PROVIDER_SITE_OTHER): Payer: 59

## 2019-10-11 ENCOUNTER — Encounter: Payer: Self-pay | Admitting: Family

## 2019-10-11 DIAGNOSIS — R5383 Other fatigue: Secondary | ICD-10-CM

## 2019-10-11 LAB — COMPREHENSIVE METABOLIC PANEL
ALT: 10 U/L (ref 0–53)
AST: 18 U/L (ref 0–37)
Albumin: 4.4 g/dL (ref 3.5–5.2)
Alkaline Phosphatase: 51 U/L (ref 39–117)
BUN: 12 mg/dL (ref 6–23)
CO2: 28 mEq/L (ref 19–32)
Calcium: 9.4 mg/dL (ref 8.4–10.5)
Chloride: 101 mEq/L (ref 96–112)
Creatinine, Ser: 1.26 mg/dL (ref 0.40–1.50)
GFR: 73.65 mL/min (ref >60.00)
Glucose, Bld: 91 mg/dL (ref 70–99)
Potassium: 4.2 mEq/L (ref 3.5–5.1)
Sodium: 136 mEq/L (ref 135–145)
Total Bilirubin: 0.8 mg/dL (ref 0.2–1.2)
Total Protein: 7.8 g/dL (ref 6.0–8.3)

## 2019-10-11 LAB — CBC WITH DIFFERENTIAL/PLATELET
Basophils Absolute: 0 10*3/uL (ref 0.0–0.1)
Basophils Relative: 0.6 % (ref 0.0–3.0)
Eosinophils Absolute: 0.1 10*3/uL (ref 0.0–0.7)
Eosinophils Relative: 1.7 % (ref 0.0–5.0)
HCT: 42.6 % (ref 39.0–52.0)
Hemoglobin: 14.5 g/dL (ref 13.0–17.0)
Lymphocytes Relative: 28.1 % (ref 12.0–46.0)
Lymphs Abs: 1.7 10*3/uL (ref 0.7–4.0)
MCHC: 34.1 g/dL (ref 30.0–36.0)
MCV: 93.4 fl (ref 78.0–100.0)
Monocytes Absolute: 0.4 10*3/uL (ref 0.1–1.0)
Monocytes Relative: 7.4 % (ref 3.0–12.0)
Neutro Abs: 3.7 10*3/uL (ref 1.4–7.7)
Neutrophils Relative %: 62.2 % (ref 43.0–77.0)
Platelets: 209 10*3/uL (ref 150.0–400.0)
RBC: 4.57 Mil/uL (ref 4.22–5.81)
RDW: 14.1 % (ref 11.5–15.5)
WBC: 6 10*3/uL (ref 4.0–10.5)

## 2019-10-11 NOTE — Telephone Encounter (Signed)
Patient "looked it up on google" patient advised results are normal

## 2019-10-11 NOTE — Addendum Note (Signed)
Addended by: Kelle Darting A on: 10/11/2019 07:31 AM   Modules accepted: Orders

## 2019-11-07 ENCOUNTER — Encounter: Payer: Self-pay | Admitting: Family

## 2019-11-10 ENCOUNTER — Encounter: Payer: Self-pay | Admitting: Family

## 2019-11-10 ENCOUNTER — Ambulatory Visit (HOSPITAL_BASED_OUTPATIENT_CLINIC_OR_DEPARTMENT_OTHER)
Admission: RE | Admit: 2019-11-10 | Discharge: 2019-11-10 | Disposition: A | Discharge status: Home / Self Care | Payer: No Typology Code available for payment source | Source: Ambulatory Visit | Attending: Family | Admitting: Family

## 2019-11-10 ENCOUNTER — Ambulatory Visit (INDEPENDENT_AMBULATORY_CARE_PROVIDER_SITE_OTHER): Payer: No Typology Code available for payment source | Admitting: Family

## 2019-11-10 ENCOUNTER — Other Ambulatory Visit: Payer: Self-pay

## 2019-11-10 VITALS — BP 123/57 | HR 53 | Temp 97.3°F | Resp 16 | Ht 70.5 in | Wt 203.0 lb

## 2019-11-10 DIAGNOSIS — R140 Abdominal distension (gaseous): Secondary | ICD-10-CM | POA: Diagnosis not present

## 2019-11-10 DIAGNOSIS — R1032 Left lower quadrant pain: Secondary | ICD-10-CM

## 2019-11-10 DIAGNOSIS — R350 Frequency of micturition: Secondary | ICD-10-CM | POA: Diagnosis not present

## 2019-11-10 LAB — POC URINALSYSI DIPSTICK (AUTOMATED)
Bilirubin, UA: NEGATIVE
Blood, UA: NEGATIVE
Glucose, UA: NEGATIVE
Ketones, UA: NEGATIVE
Leukocytes, UA: NEGATIVE
Nitrite, UA: NEGATIVE
Protein, UA: NEGATIVE
Spec Grav, UA: 1.025 (ref 1.010–1.025)
Urobilinogen, UA: 0.2 E.U./dL
pH, UA: 6 (ref 5.0–8.0)

## 2019-11-10 NOTE — Patient Instructions (Signed)
Try to eliminate all oats and dairy from your diet.

## 2019-11-10 NOTE — Progress Notes (Signed)
Subjective:    Patient ID: George Castro, male    DOB: Nov 10, 1970, 49 y.o.   MRN: [id number]  HPI  Patient is a 49 yr old male who presents today with c/o left sided flank pain (constant for the last couple years) Reports that he had some tenderness in this area last week. Notes increased gas. Reports that he moves his bowels 5-6 times a week.  Yesterday he moved his bowels 4 times yesterday- formed.  Notes discomfort from gas/bloating.  He denies blood in stool or urine.  Notes + urinary frequency. But denies dysuria.  Notes a strong odor to the urine as well.   He reports dairy products and oats seam to worsen his gas.  He also eats a lot of nuts and granola because he is out on the road.  He tries to stay away from dairy.  Uses lactaid milk.  He will eat cheese sometimes.   and bloating.   Reports that he has had increased gas recently.     Review of Systems  Past Medical History:  Diagnosis Date  . Allergy    seasonal, dust  . 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 Hypothyroid 08/25/2012  . Kidney stones    x 4 times     Social History   Socioeconomic History  . Marital status: Divorced    Spouse name: Not on file  . Number of children: 8  . Years of education: Not on file  . Highest education level: Not on file  Occupational History  . Not on file  Tobacco Use  . Smoking status: Never Smoker  . Smokeless tobacco: Never Used  Substance and Sexual Activity  . Alcohol use: Yes    Comment: occasionally pt drinks  . Drug use: No  . Sexual activity: Yes    Partners: Female  Other Topics Concern  . Not on file  Social History Narrative   Regular exercise: daily to the gym   Caffeine use: daily   Works as Naval architect   Has SW degree from [employer]   8 children- all grown   10 grandchildren   Lives alone, single.    Social Determinants of Health   Financial Resource Strain:   . Difficulty of Paying Living Expenses: Not on file  Food Insecurity:     . Worried About Programme researcher, broadcasting/film/video in the Last Year: Not on file  . Ran Out of Food in the Last Year: Not on file  Transportation Needs:   . Lack of Transportation (Medical): Not on file  . Lack of Transportation (Non-Medical): Not on file  Physical Activity:   . Days of Exercise per Week: Not on file  . Minutes of Exercise per Session: Not on file  Stress:   . Feeling of Stress : Not on file  Social Connections:   . Frequency of Communication with Friends and Family: Not on file  . Frequency of Social Gatherings with Friends and Family: Not on file  . Attends Religious Services: Not on file  . Active Member of Clubs or Organizations: Not on file  . Attends Banker Meetings: Not on file  . Marital Status: Not on file  Intimate Partner Violence:   . Fear of Current or Ex-Partner: Not on file  . Emotionally Abused: Not on file  . Physically Abused: Not on file  . Sexually Abused: Not on file    Past Surgical History:  Procedure Laterality Date  .  LUMBAR SPINE SURGERY     L-4 and L-5    Family History  Problem Relation Age of Onset  . Hyperlipidemia Maternal Grandmother   . Arthritis Maternal Grandmother   . Alcohol abuse Paternal Grandfather   . Diabetes Neg Hx     Allergies  Allergen Reactions  . Peanuts [Peanut Oil] Hives  . Shrimp [Shellfish Allergy] Hives  . Penicillins Hives    Current Outpatient Medications on File Prior to Visit  Medication Sig Dispense Refill  . EPINEPHrine (EPIPEN 2-PAK) 0.3 mg/0.3 mL IJ SOAJ injection Inject 0.3 mLs (0.3 mg total) into the muscle as needed for anaphylaxis. 2 Device 1  . levothyroxine (SYNTHROID) 75 MCG tablet Take 1 tablet (75 mcg total) by mouth daily. 90 tablet 1  . sildenafil (REVATIO) 20 MG tablet take 1 to 2 tablets by mouth 30 to 60 minutes prior to sexual activity 50 tablet 0  . valACYclovir (VALTREX) 500 MG tablet TAKE 1 TABLET(500 MG) BY MOUTH TWICE DAILY 180 tablet 1   No current  facility-administered medications on file prior to visit.    BP (!) 123/57 (BP Location: Left Arm, Patient Position: Sitting, Cuff Size: Large)   Pulse (!) 53   Temp (!) 97.3 F (36.3 C) (Temporal)   Resp 16   Ht 5' 10.5" (1.791 m)   Wt 203 lb (92.1 kg)   SpO2 99%   BMI 28.72 kg/m       Objective:   Physical Exam Constitutional:      General: He is not in acute distress.    Appearance: He is well-developed.  HENT:     Head: Normocephalic and atraumatic.  Cardiovascular:     Rate and Rhythm: Normal rate and regular rhythm.     Heart sounds: No murmur.  Pulmonary:     Effort: Pulmonary effort is normal. No respiratory distress.     Breath sounds: Normal breath sounds. No wheezing or rales.  Abdominal:     General: Bowel sounds are normal.     Palpations: Abdomen is soft. There is no hepatomegaly or mass.     Tenderness: There is no right CVA tenderness or left CVA tenderness.     Comments: Mild L lower quadrant tenderness without guarding or rebound  Musculoskeletal:     Comments: + left subscapular tenderness to palpation  Skin:    General: Skin is warm and dry.  Neurological:     Mental Status: He is alert and oriented to person, place, and time.  Psychiatric:        Behavior: Behavior normal.        Thought Content: Thought content normal.           Assessment & Plan:  Urinary frequency- UA clear, send for culture to rule out infection.  Bloating-  We discussed eliminating oats and dairy from his diet.  Will also refer to GI for further evaluation.   LLQ tenderness- ? Some underlying constipation with the mild LLQ tenderness. Will send for KUB for further evaluation.  This visit occurred during the SARS-CoV-2 public health emergency.  Safety protocols were in place, including screening questions prior to the visit, additional usage of staff PPE, and extensive cleaning of exam room while observing appropriate contact time as indicated for disinfecting  solutions.

## 2019-11-11 LAB — URINE CULTURE
MICRO NUMBER:: 10022475
Result:: NO GROWTH
SPECIMEN QUALITY:: ADEQUATE

## 2019-11-13 ENCOUNTER — Telehealth: Payer: Self-pay | Admitting: Family

## 2019-11-13 NOTE — Telephone Encounter (Signed)
See mychart.  

## 2019-12-12 ENCOUNTER — Other Ambulatory Visit: Payer: Self-pay | Admitting: Family

## 2019-12-14 ENCOUNTER — Encounter: Payer: Self-pay | Admitting: Gastroenterology

## 2019-12-20 ENCOUNTER — Encounter: Payer: 59 | Admitting: Family

## 2020-01-09 ENCOUNTER — Ambulatory Visit: Payer: No Typology Code available for payment source | Admitting: Gastroenterology

## 2020-01-09 ENCOUNTER — Encounter: Payer: Self-pay | Admitting: Gastroenterology

## 2020-01-09 ENCOUNTER — Other Ambulatory Visit: Payer: Self-pay

## 2020-01-09 VITALS — BP 106/78 | HR 67 | Temp 98.0°F | Ht 70.0 in | Wt 205.1 lb

## 2020-01-09 DIAGNOSIS — R1084 Generalized abdominal pain: Secondary | ICD-10-CM

## 2020-01-09 DIAGNOSIS — Z1211 Encounter for screening for malignant neoplasm of colon: Secondary | ICD-10-CM

## 2020-01-09 DIAGNOSIS — R143 Flatulence: Secondary | ICD-10-CM | POA: Diagnosis not present

## 2020-01-09 DIAGNOSIS — R140 Abdominal distension (gaseous): Secondary | ICD-10-CM

## 2020-01-09 NOTE — Progress Notes (Signed)
Chief Complaint: Abdominal pain, abdominal bloating, increased flatus/gas  Referring Provider:     Debbrah Alar, NP   HPI:    George Castro is a 49 y.o. male with a history of hypothyroidism referred [facility] for evaluation of abdominal pain, abdominal bloating,increased flatus.  Was seen by his PCM for this on 11/10/2019.  KUB with moderate to large colonic stool volume.  Today, he states has had increased flatus for many years, but worse lately, now daily. Generalized abdominal discomfort- non-localizing, no radiation.  Gas worse with oats, granola, apples. No change in bowel habits. Intermittent bloating and visible abdominal distension with these sxs. No hematochezia, melena. No improvement with trial of Gas-X. Started Probiotics 3 months ago, with some improvement. Constipation with addition of OTC prebiotic. Has approx 2 BM/day, which is baseline for years.  No upper GI symptoms.  Weight stable, no fever, chills, nausea, vomiting.  Normal CBC and CMP in 10/2019.  No previous EGD or colonoscopy.  No known family history of CRC, GI malignancy, liver disease, pancreatic disease, or IBD.    Past Medical History:  Diagnosis Date  . Allergy    seasonal, dust  . 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 Hypothyroid 08/25/2012  . Kidney stones    x 4 times     Past Surgical History:  Procedure Laterality Date  . LUMBAR SPINE SURGERY     L-4 and L-5   Family History  Problem Relation Age of Onset  . Hyperlipidemia Maternal Grandmother   . Arthritis Maternal Grandmother   . Alcohol abuse Paternal Grandfather   . Diabetes Neg Hx   . Colon cancer Neg Hx   . Esophageal cancer Neg Hx    Social History   Tobacco Use  . Smoking status: Never Smoker  . Smokeless tobacco: Never Used  Substance Use Topics  . Alcohol use: Yes    Comment: occasionally pt drinks  . Drug use: No   Current Outpatient Medications  Medication  Sig Dispense Refill  . sildenafil (REVATIO) 20 MG tablet TAKE 1 TO 2 TABLETS BY MOUTH 30 T0 60 MINUTES PRIOR TO SEXUAL ACTIVITY 50 tablet 0  . valACYclovir (VALTREX) 500 MG tablet TAKE 1 TABLET(500 MG) BY MOUTH TWICE DAILY 180 tablet 1  . EPINEPHrine (EPIPEN 2-PAK) 0.3 mg/0.3 mL IJ SOAJ injection Inject 0.3 mLs (0.3 mg total) into the muscle as needed for anaphylaxis. (Patient not taking: Reported on 01/09/2020) 2 Device 1  . levothyroxine (SYNTHROID) 75 MCG tablet Take 1 tablet (75 mcg total) by mouth daily. (Patient not taking: Reported on 01/09/2020) 90 tablet 1   No current facility-administered medications for this visit.   Allergies  Allergen Reactions  . Peanuts [Peanut Oil] Hives  . Shrimp [Shellfish Allergy] Hives  . Penicillins Hives     Review of Systems: All systems reviewed and negative except where noted in HPI.     Physical Exam:    Wt Readings from Last 3 Encounters:  01/09/20 205 lb 2 oz (93 kg)  11/10/19 203 lb (92.1 kg)  09/22/19 198 lb (89.8 kg)    BP 106/78   Pulse 67   Temp 98 F (36.7 C)   Ht 5\' 10"  (1.778 m)   Wt 205 lb 2 oz (93 kg)   BMI 29.43 kg/m  Constitutional:  Pleasant, in no acute distress. Psychiatric: Normal mood and affect. Behavior is normal. EENT: Pupils  normal.  Conjunctivae are normal. No scleral icterus. Neck supple. No cervical LAD. Cardiovascular: Normal rate, regular rhythm. No edema Pulmonary/chest: Effort normal and breath sounds normal. No wheezing, rales or rhonchi. Abdominal: Mild TTP in lower abdomen without rebound or guarding.  No peritoneal signs.  Soft, nondistended. Bowel sounds active throughout. There are no masses palpable. No hepatomegaly. Neurological: Alert and oriented to person place and time. Skin: Skin is warm and dry. No rashes noted.   ASSESSMENT AND PLAN;   1) Increased flatus/increased gas 2) Abdominal bloating 3) Generalized abdominal pain  - Low FODMAP diet.  Provided with handout and detailed  explanation -Start fiber supplement -Diet diary -Can certainly evaluate for additional mucosal/luminal pathology at time of colonoscopy as below -Okay to resume probiotic given symptomatic improvement -If evaluation unrevealing and ongoing symptoms despite appropriate intervention, consented for carbohydrate breath testing vs empiric trial of rifaximin -Relative paucity of upper GI symptoms.  Again, if evaluation unrevealing, can consider EGD with duodenal biopsies  4) Colon cancer screening -Due for age-appropriate, average risk CRC screening -Schedule colonoscopy  The indications, risks, and benefits of colonoscopy were explained to the patient in detail. Risks include but are not limited to bleeding, perforation, adverse reaction to medications, and cardiopulmonary compromise. Sequelae include but are not limited to the possibility of surgery, hospitalization, and mortality. The patient verbalized understanding and wished to proceed. All questions answered, referred to the scheduler and bowel prep ordered. Further recommendations pending results of the exam.    Shellia Cleverly, DO, FACG  01/09/2020, 3:41 PM   Sandford Craze, NP

## 2020-01-09 NOTE — Patient Instructions (Signed)
Low FODMAP Diet: (Fermentable Oligosaccharides, Disaccharides, Monosaccharides, and Polyols) These are short chain carbohydrates and sugar alcohols that are poorly absorbed by the body, resulting in multiple abdominal symptoms, including changes in bowel habits, abdominal pain/discomfort, bloating, abdominal distension, gas, etc.     Please start taking a daily fiber supplement such as citrucel, benefiber, metamucil, or fiber choice.   It has been recommended to you by your physician that you have a(n) Colonoscopy  completed. Per your request, we did not schedule the procedure(s) today. Please contact our office at [phone] should you decide to have the procedure completed. You will be scheduled for a pre-visit and procedure at that time.  It was a pleasure to see you today!  Vito Cirigliano, D.O.

## 2020-05-09 ENCOUNTER — Other Ambulatory Visit: Payer: Self-pay | Admitting: Family

## 2020-07-03 ENCOUNTER — Other Ambulatory Visit: Payer: Self-pay | Admitting: Family

## 2020-07-06 ENCOUNTER — Other Ambulatory Visit: Payer: Self-pay | Admitting: Family

## 2020-08-06 ENCOUNTER — Other Ambulatory Visit: Payer: Self-pay | Admitting: Family

## 2020-09-18 ENCOUNTER — Other Ambulatory Visit: Payer: Self-pay

## 2020-09-18 ENCOUNTER — Ambulatory Visit (INDEPENDENT_AMBULATORY_CARE_PROVIDER_SITE_OTHER): Payer: Self-pay | Admitting: Family Medicine

## 2020-09-18 ENCOUNTER — Encounter: Payer: Self-pay | Admitting: Family Medicine

## 2020-09-18 DIAGNOSIS — J309 Allergic rhinitis, unspecified: Secondary | ICD-10-CM

## 2020-09-18 MED ORDER — CETIRIZINE HCL 10 MG PO TABS: 10.0000 mg | ORAL_TABLET | Freq: Every day | ORAL | 11 refills | Status: DC

## 2020-09-18 MED ORDER — FLUTICASONE PROPIONATE 50 MCG/ACT NA SUSP: 2.0000 | Freq: Every day | NASAL | 6 refills | Status: AC

## 2020-09-18 NOTE — Progress Notes (Signed)
11/17/20217:30 PM  George Castro 1970-12-25, 49 y.o., male [id number]  Chief Complaint  Patient presents with  . Sinus Problem    sinus pressure started a couple of weeks ago, took otc claritin D - symptoms came back sunday. sneezing , sweats, headache, runny nose    HPI:   Patient is a 49 y.o. male with past medical history significant for allergies and hypothyroid who presents today for sinus issues.  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   Allergies  Allergen Reactions  . Peanuts [Peanut Oil] Hives  . Shrimp [Shellfish Allergy] Hives  . Penicillins Hives    Prior to Admission medications   Medication Sig Start Date End Date Taking? Authorizing Provider  EPINEPHrine (EPIPEN 2-PAK) 0.3 mg/0.3 mL IJ SOAJ injection Inject 0.3 mLs (0.3 mg total) into the muscle as needed for anaphylaxis. 02/07/19  Yes Sandford Craze, NP  levothyroxine (SYNTHROID) 75 MCG tablet Take 1 tablet (75 mcg total) by mouth daily before breakfast. 05/09/20  Yes Sandford Craze, NP  sildenafil (REVATIO) 20 MG tablet TAKE ONE OR TWO TABLETS BY MOUTH THIRTY TO SIXTY MINUTES PRIOR TO SEXUAL ACTIVITY 07/08/20  Yes Sandford Craze, NP  valACYclovir (VALTREX) 500 MG tablet TAKE ONE TABLET BY MOUTH TWICE DAILY 08/06/20  Yes Sandford Craze, NP     Past Medical History:  Diagnosis Date  . Allergy    seasonal, dust  . 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 Hypothyroid 08/25/2012  . Kidney stones    x 4 times    Past Surgical History:  Procedure Laterality Date  . LUMBAR SPINE SURGERY     L-4 and L-5    Social History   Tobacco Use  . Smoking status: Never Smoker  . Smokeless tobacco: Never Used  Substance Use Topics  . Alcohol use: Yes    Comment: occasionally pt drinks    Family History  Problem Relation Age of Onset  . Hyperlipidemia Maternal Grandmother   . Arthritis Maternal Grandmother   . Alcohol abuse Paternal Grandfather   . Diabetes Neg Hx   . Colon cancer Neg Hx   . Esophageal cancer Neg Hx     Review of Systems  Constitutional: Negative for chills, fever and weight loss.  HENT: Positive for congestion and sore throat. Negative for ear pain and sinus pain.   Respiratory: Negative for cough, sputum production, shortness of breath and wheezing.   Musculoskeletal: Negative for myalgias.  Neurological: Negative for headaches.     OBJECTIVE:  There were no vitals filed for this visit. There is no height or weight on file to calculate BMI.   Physical Exam Vitals reviewed.  Constitutional:      Appearance: Normal appearance.  HENT:     Head: Normocephalic and atraumatic.     Right Ear: Tympanic membrane and ear canal normal.     Left Ear: Tympanic membrane and ear canal normal.     Nose: Nose normal.     Mouth/Throat:     Mouth: Mucous membranes are moist.     Pharynx: Oropharynx is clear. No oropharyngeal exudate or posterior oropharyngeal erythema.  Eyes:     Conjunctiva/sclera: Conjunctivae normal.     Pupils: Pupils are equal, round, and reactive to light.  Cardiovascular:     Rate and Rhythm: Normal rate and regular rhythm.     Pulses: Normal pulses.     Heart sounds: Normal heart sounds. No murmur heard.  No friction rub. No gallop.   Pulmonary:     Effort:  Pulmonary effort is normal. No respiratory distress.     Breath sounds: Normal breath sounds. No stridor. No wheezing or rales.  Abdominal:     General: Bowel sounds are normal.     Palpations: Abdomen is soft.     Tenderness: There is no abdominal tenderness.  Musculoskeletal:     Right lower leg: No edema.     Left lower leg: No edema.  Skin:    General: Skin is warm and dry.  Neurological:     General: No focal deficit present.     Mental Status: He is alert and oriented to person, place, and time.  Psychiatric:        Mood and Affect: Mood normal.        Behavior: Behavior normal.     No results found for this or any previous visit (from the past 24 hour(s)).  No results found.   ASSESSMENT and PLAN  Problem List Items Addressed This Visit    None    Visit Diagnoses    Allergic rhinitis, unspecified seasonality, unspecified trigger    -  Primary   Relevant Medications   fluticasone (FLONASE) 50 MCG/ACT nasal spray daily   cetirizine (ZYRTEC) 10 MG tablet nightly  Discussed conservative management and minimizing triggers Discussed the use of preventative medication. RTC precautions provided Discussed r/se/b of medications.   Other Relevant Orders   Novel Coronavirus, NAA ([facility])       Return in about 2 weeks (around 10/02/2020), or if symptoms worsen or fail to improve.    Macario Carls Reba Hulett, FNP-BC [facility] [address] [place], [place] [address] Ph.  [phone] Fax [phone]

## 2020-09-18 NOTE — Patient Instructions (Signed)
Allergic Rhinitis, Adult Allergic rhinitis is a reaction to allergens in the air. Allergens are tiny specks (particles) in the air that cause your body to have an allergic reaction. This condition cannot be passed from person to person (is not contagious). Allergic rhinitis cannot be cured, but it can be controlled. There are two types of allergic rhinitis:  Seasonal. This type is also called hay fever. It happens only during certain times of the year.  Perennial. This type can happen at any time of the year. What are the causes? This condition may be caused by:  Pollen from grasses, trees, and weeds.  House dust mites.  Pet dander.  Mold. What are the signs or symptoms? Symptoms of this condition include:  Sneezing.  Runny or stuffy nose (nasal congestion).  A lot of mucus in the back of the throat (postnasal drip).  Itchy nose.  Tearing of the eyes.  Trouble sleeping.  Being sleepy during day. How is this treated? There is no cure for this condition. You should avoid things that trigger your symptoms (allergens). Treatment can help to relieve symptoms. This may include:  Medicines that block allergy symptoms, such as antihistamines. These may be given as a shot, nasal spray, or pill.  Shots that are given until your body becomes less sensitive to the allergen (desensitization).  Stronger medicines, if all other treatments have not worked. Follow these instructions at home: Avoiding allergens   Find out what you are allergic to. Common allergens include smoke, dust, and pollen.  Avoid them if you can. These are some of the things that you can do to avoid allergens: ? Replace carpet with wood, tile, or vinyl flooring. Carpet can trap dander and dust. ? Clean any mold found in the home. ? Do not smoke. Do not allow smoking in your home. ? Change your heating and air conditioning filter at least once a month. ? During allergy season:  Keep windows closed as much as  you can. If possible, use air conditioning when there is a lot of pollen in the air.  Use a special filter for allergies with your furnace and air conditioner.  Plan outdoor activities when pollen counts are lowest. This is usually during the early morning or evening hours.  If you do go outdoors when pollen count is high, wear a special mask for people with allergies.  When you come indoors, take a shower and change your clothes before sitting on furniture or bedding. General instructions  Do not use fans in your home.  Do not hang clothes outside to dry.  Wear sunglasses to keep pollen out of your eyes.  Wash your hands right away after you touch household pets.  Take over-the-counter and prescription medicines only as told by your doctor.  Keep all follow-up visits as told by your doctor. This is important. Contact a doctor if:  You have a fever.  You have a cough that does not go away (is persistent).  You start to make whistling sounds when you breathe (wheeze).  Your symptoms do not get better with treatment.  You have thick fluid coming from your nose.  You start to have nosebleeds. Get help right away if:  Your tongue or your lips are swollen.  You have trouble breathing.  You feel dizzy or you feel like you are going to pass out (faint).  You have cold sweats. Summary  Allergic rhinitis is a reaction to allergens in the air.  This condition may be   caused by allergens. These include pollen, dust mites, pet dander, and mold.  Symptoms include a runny, itchy nose, sneezing, or tearing eyes. You may also have trouble sleeping or feel sleepy during the day.  Treatment includes taking medicines and avoiding allergens. You may also get shots or take stronger medicines.  Get help if you have a fever or a cough that does not stop. Get help right away if you are short of breath. This information is not intended to replace advice given to you by your health care  provider. Make sure you discuss any questions you have with your health care provider. Document Revised: 02/07/2019 Document Reviewed: 05/10/2018 Elsevier Patient Education  2020 Elsevier Inc.  

## 2020-09-19 ENCOUNTER — Encounter: Payer: Self-pay | Admitting: Family Medicine

## 2020-09-19 LAB — SARS-COV-2, NAA 2 DAY TAT

## 2020-09-19 LAB — NOVEL CORONAVIRUS, NAA: SARS-CoV-2, NAA: NOT DETECTED

## 2020-09-19 NOTE — Progress Notes (Signed)
Hello, If you could let him know his Covid came back negative. Thanks!

## 2020-09-23 ENCOUNTER — Other Ambulatory Visit: Payer: Self-pay | Admitting: Family

## 2020-10-14 ENCOUNTER — Other Ambulatory Visit: Payer: Self-pay | Admitting: Family

## 2020-12-18 ENCOUNTER — Telehealth: Payer: Self-pay | Admitting: Family

## 2020-12-18 NOTE — Telephone Encounter (Signed)
Patient is requesting a refill of sildenafil (REVATIO) 20 MG tablet. Patient states he is unable to make an appointment at this time. Patient is requesting a few pills.  Please advise

## 2020-12-19 MED ORDER — SILDENAFIL CITRATE 20 MG PO TABS: ORAL_TABLET | ORAL | 0 refills | Status: DC

## 2020-12-19 NOTE — Addendum Note (Signed)
Addended by: Sandford Craze on: 12/19/2020 01:34 PM   Modules accepted: Orders

## 2020-12-19 NOTE — Telephone Encounter (Signed)
Patient is calling to check status of medication refill, patient states he does not have insurance at this time but would like a few pills to hold him over until insurance is active.

## 2020-12-19 NOTE — Telephone Encounter (Signed)
Rx sent to [facility].  Please schedule follow up at his earliest convenience.

## 2020-12-20 ENCOUNTER — Other Ambulatory Visit: Payer: Self-pay

## 2020-12-20 MED ORDER — SILDENAFIL CITRATE 20 MG PO TABS: ORAL_TABLET | ORAL | 0 refills | Status: DC

## 2020-12-20 NOTE — Telephone Encounter (Signed)
Patient advised rx was sent and to schedule follow up visit. He requested the rx to be sent to [facility] in [place]. Rx at [facility] cancelled.

## 2021-02-22 ENCOUNTER — Other Ambulatory Visit: Payer: Self-pay | Admitting: Family

## 2021-02-24 ENCOUNTER — Encounter: Payer: Self-pay | Admitting: Family

## 2021-02-24 DIAGNOSIS — E039 Hypothyroidism, unspecified: Secondary | ICD-10-CM

## 2021-08-27 ENCOUNTER — Other Ambulatory Visit: Payer: Self-pay | Admitting: Family

## 2022-01-01 ENCOUNTER — Other Ambulatory Visit: Payer: Self-pay | Admitting: Family

## 2022-01-07 ENCOUNTER — Telehealth: Payer: Self-pay

## 2022-01-07 ENCOUNTER — Telehealth: Payer: Self-pay | Admitting: Family

## 2022-01-07 MED ORDER — LEVOTHYROXINE SODIUM 75 MCG PO TABS: ORAL_TABLET | ORAL | 0 refills | Status: DC

## 2022-01-07 MED ORDER — VALACYCLOVIR HCL 500 MG PO TABS: 500.0000 mg | ORAL_TABLET | Freq: Two times a day (BID) | ORAL | 0 refills | Status: DC

## 2022-01-07 MED ORDER — SILDENAFIL CITRATE 20 MG PO TABS: ORAL_TABLET | ORAL | 0 refills | Status: DC

## 2022-01-07 NOTE — Telephone Encounter (Signed)
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

## 2022-01-07 NOTE — Telephone Encounter (Signed)
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 Time Disposition Final User ?01/07/2022 7:04:57 AM General Information Provided Yes Salvatore Marvel ?Call Closed By: Salvatore Marvel ?Transaction Date/Time: 01/07/2022 7:02:18 AM (ET) ?

## 2022-01-07 NOTE — Telephone Encounter (Signed)
Pt is hoping to get enough medication to last him until his cpe. ? ?Medication: levothyroxine (SYNTHROID) 75 MCG tablet  ? ?sildenafil (REVATIO) 20 MG tablet  ? ?valACYclovir (VALTREX) 500 MG tablet ? ?Has the patient contacted their pharmacy? No. ? ?Preferred Pharmacy: [facility] [address], [place] - [address]  ?[address] [place] [address]  ?Phone:  [phone]  Fax:  [phone]  ? ? ?

## 2022-01-07 NOTE — Telephone Encounter (Signed)
Left message on machine that rxs have been sent in. 

## 2022-01-20 ENCOUNTER — Ambulatory Visit (INDEPENDENT_AMBULATORY_CARE_PROVIDER_SITE_OTHER): Payer: 59 | Admitting: Family

## 2022-01-20 ENCOUNTER — Encounter: Payer: Self-pay | Admitting: Family

## 2022-01-20 VITALS — BP 117/80 | HR 58 | Temp 98.3°F | Resp 16 | Ht 70.0 in | Wt 207.4 lb

## 2022-01-20 DIAGNOSIS — Z0000 Encounter for general adult medical examination without abnormal findings: Secondary | ICD-10-CM

## 2022-01-20 DIAGNOSIS — Z23 Encounter for immunization: Secondary | ICD-10-CM

## 2022-01-20 DIAGNOSIS — B009 Herpesviral infection, unspecified: Secondary | ICD-10-CM

## 2022-01-20 DIAGNOSIS — M5416 Radiculopathy, lumbar region: Secondary | ICD-10-CM

## 2022-01-20 DIAGNOSIS — Z1211 Encounter for screening for malignant neoplasm of colon: Secondary | ICD-10-CM

## 2022-01-20 DIAGNOSIS — E039 Hypothyroidism, unspecified: Secondary | ICD-10-CM

## 2022-01-20 DIAGNOSIS — Z125 Encounter for screening for malignant neoplasm of prostate: Secondary | ICD-10-CM | POA: Diagnosis not present

## 2022-01-20 MED ORDER — LEVOTHYROXINE SODIUM 75 MCG PO TABS: ORAL_TABLET | ORAL | 0 refills | Status: DC

## 2022-01-20 MED ORDER — EPINEPHRINE 0.3 MG/0.3ML IJ SOAJ: 0.3000 mg | INTRAMUSCULAR | 1 refills | Status: DC | PRN

## 2022-01-20 MED ORDER — VALACYCLOVIR HCL 500 MG PO TABS: 500.0000 mg | ORAL_TABLET | Freq: Two times a day (BID) | ORAL | 1 refills | Status: DC

## 2022-01-20 MED ORDER — MELOXICAM 7.5 MG PO TABS: 7.5000 mg | ORAL_TABLET | Freq: Every day | ORAL | 0 refills | Status: DC

## 2022-01-20 NOTE — Assessment & Plan Note (Signed)
Discussed diet/exercise/weight loss. Encouraged pt to get bivalent booster and schedule routine dental exam.  ?

## 2022-01-20 NOTE — Assessment & Plan Note (Signed)
Uncontrolled. Trial of meloxicam 7.5 mg once daily.  ?

## 2022-01-20 NOTE — Assessment & Plan Note (Addendum)
Stable when on suppressive valtrex. Continue same.  ?

## 2022-01-20 NOTE — Patient Instructions (Addendum)
Start meloxicam once daily for leg/back pain. ?Schedule a routine dental exam.  ?Try to get 30 minutes of exercise 5 days a week.  ?Please stop at the lab on your way out  ?

## 2022-01-20 NOTE — Assessment & Plan Note (Signed)
Lab Results  ?Component Value Date  ? TSH 0.48 09/13/2019  ? ?Clinically stable on synthroid. Check tsh.  ?Wt Readings from Last 3 Encounters:  ?01/20/22 207 lb 6.4 oz (94.1 kg)  ?01/09/20 205 lb 2 oz (93 kg)  ?11/10/19 203 lb (92.1 kg)  ? ? ?

## 2022-01-20 NOTE — Progress Notes (Addendum)
? ?Subjective:  ? ?By signing my name below, I, George Castro, attest that this documentation has been prepared under the direction and in the presence of George Castro, George Ishida NP, 01/20/2022   ? ? Patient ID: George Castro, male    DOB: 1971/01/09, 51 y.o.   MRN: [id number] ? ?Chief Complaint  ?Patient presents with  ? Annual Exam  ? Back Pain  ?  Complains of low back pain with left him and radiating to left leg  ? ? ?HPI ?Patient is in today for a comprehensive physical exam. ? ?Abdominal/ Back/ Leg Pain - Patient complains of pain around the abdominal that radiates to the lower back and inner legs. His back locks up frequently throughout the day, about 3 to 4 times a day. Pain is described as an aching pain.  ? ?Refill - He is requesting a refill of 500 MG of Valtrex. He is also requesting a refill of 0.3 mg/0.3 mL Epipen 2 PAK. He is also requesting an additional 90 tablets of Synthroid. ? ?Eye Pain - He reports mild eye irritation. Using visine which has been helpful.  ? ?Depression - Patient reports of mild depression symptoms. 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   ? ?Herpes - Patient described increased symptoms of herpes when discontinuing use of 500 MG Valtrex.  ? ?Thyroid - He is continuing to take 75 MCG of Synthroid. ?Lab Results  ?Component Value Date  ? TSH 2.41 01/20/2022  ? ?He denies having any fever, ear pain, new muscle pain, joint pain, new moles, congestion, sinus pain, sore throat, palpations, wheezing, n/v/d, constipation, blood in stool, dysuria, frequency, hematuria at this time. ? ? ? ?Social History: He reports no changes to his social history. He reports no recent surgeries. ?PSA: Last completed 04/02/2017 ?Immunizations: He is interested in receiving the Shingrix vaccine. He is not interested in receiving a Hepatitis C screening. He has not received the Bivalent COVID - 19 vaccine. ?Diet: His diet has not been well. He eats unhealthy meals.   ?Exercise: He is trying to get back into regularly exercising.  ?Dental: His is not UTD on dental exams.  ?Vision: He is UTD on vision exams. ? ?Health Maintenance Due  ?Topic Date Due  ? COLONOSCOPY (Pts [phone] Insurance coverage will need to be confirmed)  Never done  ? COVID-19 Vaccine (3 - Booster for Pfizer series) 09/21/2020  ? ? ?Past Medical History:  ?Diagnosis Date  ? Allergy   ? seasonal, dust  ? 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 Hypothyroid 08/25/2012  ? Kidney stones   ? x 4 times  ? ? ?Past Surgical History:  ?Procedure Laterality Date  ? LUMBAR SPINE SURGERY    ? L-4 and L-5  ? ? ?Family History  ?Problem Relation Age of Onset  ? Hyperlipidemia Maternal Grandmother   ? Arthritis Maternal Grandmother   ? Alcohol abuse Paternal Grandfather   ? Diabetes Neg Hx   ? Colon cancer Neg Hx   ? Esophageal cancer Neg Hx   ? ? ?Social History  ? ?Socioeconomic History  ? Marital status: Divorced  ?  Spouse name: Not on file  ? Number of children: 8  ? Years of education: Not on file  ? Highest education level: Not on file  ?Occupational History  ? Not on file  ?Tobacco Use  ? Smoking status: Never  ? Smokeless tobacco: Never  ?Vaping Use  ? Vaping Use: Never  used  ?Substance and Sexual Activity  ? Alcohol use: Yes  ?  Comment: occasionally pt drinks  ? Drug use: No  ? Sexual activity: Yes  ?  Partners: Female  ?Other Topics Concern  ? Not on file  ?Social History Narrative  ? Regular exercise: daily to the gym  ? Caffeine use: daily  ? Works as Naval architect  ? Has SW degree from [employer]  ? 8 children- all grown  ? 10 grandchildren  ? Lives alone, single.   ? ?Social Determinants of Health  ? ?Financial Resource Strain: Not on file  ?Food Insecurity: Not on file  ?Transportation Needs: Not on file  ?Physical Activity: Not on file  ?Stress: Not on file  ?Social Connections: Not on file  ?Intimate Partner Violence: Not on file  ? ? ?Outpatient Medications Prior to Visit  ?Medication Sig Dispense  Refill  ? cetirizine (ZYRTEC) 10 MG tablet Take 1 tablet (10 mg total) by mouth daily. 30 tablet 11  ? fluticasone (FLONASE) 50 MCG/ACT nasal spray Place 2 sprays into both nostrils daily. 16 g 6  ? sildenafil (REVATIO) 20 MG tablet TAKE ONE OR TWO TABLETS BY MOUTH 30 TO 60 MINUTES BEFORE SEXUAL ACTIVITY. DO NOT USE MORE THAN ONE DOSE DAILY 25 tablet 0  ? EPINEPHrine (EPIPEN 2-PAK) 0.3 mg/0.3 mL IJ SOAJ injection Inject 0.3 mLs (0.3 mg total) into the muscle as needed for anaphylaxis. 2 Device 1  ? levothyroxine (SYNTHROID) 75 MCG tablet TAKE 1 TABLET BY MOUTH ONCE DAILY BEFORE BREAKFAST. (NEEDS APPOINTMENT FOR FURTHER FILLS) 30 tablet 0  ? valACYclovir (VALTREX) 500 MG tablet Take 1 tablet (500 mg total) by mouth 2 (two) times daily. 60 tablet 0  ? ?No facility-administered medications prior to visit.  ? ? ?Allergies  ?Allergen Reactions  ? Peanuts [Peanut Oil] Hives  ? Shrimp [Shellfish Allergy] Hives  ? Penicillins Hives  ? ? ?Review of Systems  ?Constitutional:  Negative for fever.  ?HENT:  Negative for ear pain, sinus pain and sore throat.   ?Eyes:  Positive for pain.  ?Respiratory:  Negative for wheezing.   ?Cardiovascular:  Negative for palpitations.  ?Gastrointestinal:  Positive for abdominal pain. Negative for blood in stool, constipation, diarrhea, nausea and vomiting.  ?Genitourinary:  Negative for dysuria, frequency and hematuria.  ?Musculoskeletal:  Positive for back pain (Aching Pain). Negative for joint pain and myalgias.  ?     (+) Inner Leg Pain  ?Skin:   ?     (-) New Moles  ?Neurological:  Negative for headaches.  ?Psychiatric/Behavioral:  Positive for depression. The patient is not nervous/anxious.   ? ?   ?Objective:  ?  ?Physical Exam ?Constitutional:   ?   General: He is not in acute distress. ?   Appearance: Normal appearance. He is not ill-appearing.  ?HENT:  ?   Head: Normocephalic and atraumatic.  ?   Right Ear: Tympanic membrane, ear canal and external ear normal.  ?   Left Ear: Tympanic  membrane, ear canal and external ear normal.  ?Eyes:  ?   Extraocular Movements: Extraocular movements intact.  ?   Pupils: Pupils are equal, round, and reactive to light.  ?Cardiovascular:  ?   Rate and Rhythm: Normal rate and regular rhythm.  ?   Heart sounds: Normal heart sounds. No murmur heard. ?  No gallop.  ?Pulmonary:  ?   Effort: Pulmonary effort is normal. No respiratory distress.  ?   Breath sounds: Normal breath sounds.  No wheezing or rales.  ?Abdominal:  ?   General: Bowel sounds are normal. There is no distension.  ?   Palpations: Abdomen is soft.  ?   Tenderness: There is no abdominal tenderness. There is no guarding.  ?Genitourinary: ?   Comments:   ?Musculoskeletal:  ?   Comments: 5/5 strength in both upper and lower extremities  ?Skin: ?   General: Skin is warm and dry.  ?Neurological:  ?   Mental Status: He is alert and oriented to person, place, and time.  ?   Deep Tendon Reflexes:  ?   Reflex Scores: ?     Patellar reflexes are 2+ on the right side and 2+ on the left side. ?Psychiatric:     ?   Mood and Affect: Mood normal.     ?   Behavior: Behavior normal.     ?   Judgment: Judgment normal.  ? ? ?Pulse (!) 58   Temp 98.3 ?F (36.8 ?C) (Oral)   Resp 16   Ht 5\' 10"  (1.778 m)   Wt 207 lb 6.4 oz (94.1 kg)   SpO2 98%   BMI 29.76 kg/m?  ?Wt Readings from Last 3 Encounters:  ?01/20/22 207 lb 6.4 oz (94.1 kg)  ?01/09/20 205 lb 2 oz (93 kg)  ?11/10/19 203 lb (92.1 kg)  ? ? ?   ?Assessment & Plan:  ? ?Problem List Items Addressed This Visit   ? ?  ? Unprioritized  ? Routine general medical examination at a health care facility - Primary  ?  Discussed diet/exercise/weight loss. Encouraged pt to get bivalent booster and schedule routine dental exam.  ?  ?  ? Lumbar radiculopathy  ?  Uncontrolled. Trial of meloxicam 7.5 mg once daily.  ?  ?  ? Hypothyroid  ?  Lab Results  ?Component Value Date  ? TSH 0.48 09/13/2019  ?Clinically stable on synthroid. Check tsh.  ?Wt Readings from Last 3 Encounters:   ?01/20/22 207 lb 6.4 oz (94.1 kg)  ?01/09/20 205 lb 2 oz (93 kg)  ?11/10/19 203 lb (92.1 kg)  ? ?  ?  ? Relevant Medications  ? levothyroxine (SYNTHROID) 75 MCG tablet  ? Other Relevant Orders  ? TSH (Complet

## 2022-01-21 LAB — TSH: TSH: 2.41 u[IU]/mL (ref 0.35–5.50)

## 2022-01-21 LAB — PSA: PSA: 0.72 ng/mL (ref 0.10–4.00)

## 2022-02-09 ENCOUNTER — Other Ambulatory Visit: Payer: Self-pay | Admitting: Family

## 2022-02-09 MED ORDER — MELOXICAM 7.5 MG PO TABS: 7.5000 mg | ORAL_TABLET | Freq: Every day | ORAL | 0 refills | Status: DC

## 2022-03-13 ENCOUNTER — Telehealth: Payer: Self-pay | Admitting: Family

## 2022-03-13 DIAGNOSIS — Z1211 Encounter for screening for malignant neoplasm of colon: Secondary | ICD-10-CM

## 2022-03-13 NOTE — Telephone Encounter (Signed)
See mychart.  

## 2022-03-16 NOTE — Addendum Note (Signed)
Addended by: Sandford Craze on: 03/16/2022 09:17 AM ? ? Modules accepted: Orders ? ?

## 2022-03-25 ENCOUNTER — Other Ambulatory Visit: Payer: Self-pay | Admitting: Family Medicine

## 2022-03-25 ENCOUNTER — Other Ambulatory Visit: Payer: Self-pay | Admitting: Family

## 2022-03-25 MED ORDER — MELOXICAM 7.5 MG PO TABS: 7.5000 mg | ORAL_TABLET | Freq: Every day | ORAL | 0 refills | Status: DC

## 2022-03-25 MED ORDER — SILDENAFIL CITRATE 20 MG PO TABS: ORAL_TABLET | ORAL | 0 refills | Status: DC

## 2022-04-05 LAB — COLOGUARD: Cologuard: NEGATIVE

## 2022-04-15 LAB — COLOGUARD: COLOGUARD: NEGATIVE

## 2022-04-15 NOTE — Telephone Encounter (Signed)
Results abstracted

## 2022-05-25 ENCOUNTER — Other Ambulatory Visit: Payer: Self-pay | Admitting: Family

## 2022-05-25 MED ORDER — MELOXICAM 7.5 MG PO TABS: 7.5000 mg | ORAL_TABLET | Freq: Every day | ORAL | 0 refills | Status: DC

## 2022-05-25 MED ORDER — SILDENAFIL CITRATE 20 MG PO TABS: ORAL_TABLET | ORAL | 1 refills | Status: DC

## 2022-05-25 MED ORDER — LEVOTHYROXINE SODIUM 75 MCG PO TABS: 75.0000 ug | ORAL_TABLET | Freq: Every day | ORAL | 0 refills | Status: DC

## 2022-07-28 ENCOUNTER — Telehealth: Payer: No Typology Code available for payment source | Admitting: Family

## 2022-08-04 ENCOUNTER — Other Ambulatory Visit: Payer: Self-pay

## 2022-08-04 ENCOUNTER — Telehealth: Payer: Self-pay | Admitting: Family

## 2022-08-04 DIAGNOSIS — J309 Allergic rhinitis, unspecified: Secondary | ICD-10-CM

## 2022-08-04 MED ORDER — VALACYCLOVIR HCL 500 MG PO TABS: 500.0000 mg | ORAL_TABLET | Freq: Two times a day (BID) | ORAL | 1 refills | Status: DC

## 2022-08-04 MED ORDER — CETIRIZINE HCL 10 MG PO TABS: 10.0000 mg | ORAL_TABLET | Freq: Every day | ORAL | 11 refills | Status: DC

## 2022-08-04 MED ORDER — SILDENAFIL CITRATE 20 MG PO TABS: ORAL_TABLET | ORAL | 1 refills | Status: DC

## 2022-08-04 MED ORDER — CETIRIZINE HCL 10 MG PO TABS: 10.0000 mg | ORAL_TABLET | Freq: Every day | ORAL | 11 refills | Status: AC

## 2022-08-04 NOTE — Telephone Encounter (Signed)
Medications refilled

## 2022-08-04 NOTE — Telephone Encounter (Signed)
Medication: valACYclovir (VALTREX) 500 MG tablet   sildenafil (REVATIO) 20 MG tablet   cetirizine (ZYRTEC) 10 MG tablet   Has the patient contacted their pharmacy? No.   Preferred Pharmacy:  [facility] [place], [place] - [address], [place] [address] Phone: [phone]  Fax: [phone]

## 2022-10-07 ENCOUNTER — Telehealth: Payer: Self-pay | Admitting: Family

## 2022-10-07 MED ORDER — SILDENAFIL CITRATE 20 MG PO TABS: ORAL_TABLET | ORAL | 0 refills | Status: DC

## 2022-10-07 MED ORDER — VALACYCLOVIR HCL 500 MG PO TABS: 500.0000 mg | ORAL_TABLET | Freq: Two times a day (BID) | ORAL | 0 refills | Status: DC

## 2022-10-07 NOTE — Telephone Encounter (Signed)
Last OV 01/20/2022, will send a 30 day supply, was supposed to f/u in September. Please schedule at his convenience.

## 2022-10-07 NOTE — Telephone Encounter (Signed)
Prescription Request  10/07/2022  Is this a "Controlled Substance" medicine? No  LOV: Visit date not found  What is the name of the medication or equipment?   sildenafil (REVATIO) 20 MG tablet [[id number]]   valACYclovir (VALTREX) 500 MG tablet [[id number]]   Have you contacted your pharmacy to request a refill? No   [facility] [address] [place], [place] - [address] [place] [address] Phone: [phone] Fax: [phone]    Patient notified that their request is being sent to the clinical staff for review and that they should receive a response within 2 business days.   Please advise at Mobile [phone] (mobile)

## 2023-01-05 ENCOUNTER — Encounter: Payer: Self-pay | Admitting: Family

## 2023-01-05 ENCOUNTER — Ambulatory Visit: Payer: No Typology Code available for payment source | Admitting: Family

## 2023-01-05 VITALS — BP 119/81 | HR 64 | Temp 97.9°F | Resp 16 | Wt 197.0 lb

## 2023-01-05 DIAGNOSIS — R3129 Other microscopic hematuria: Secondary | ICD-10-CM

## 2023-01-05 DIAGNOSIS — B009 Herpesviral infection, unspecified: Secondary | ICD-10-CM | POA: Diagnosis not present

## 2023-01-05 DIAGNOSIS — M5416 Radiculopathy, lumbar region: Secondary | ICD-10-CM

## 2023-01-05 DIAGNOSIS — N529 Male erectile dysfunction, unspecified: Secondary | ICD-10-CM

## 2023-01-05 DIAGNOSIS — Z23 Encounter for immunization: Secondary | ICD-10-CM | POA: Diagnosis not present

## 2023-01-05 DIAGNOSIS — Z91018 Allergy to other foods: Secondary | ICD-10-CM

## 2023-01-05 DIAGNOSIS — E039 Hypothyroidism, unspecified: Secondary | ICD-10-CM

## 2023-01-05 DIAGNOSIS — M25551 Pain in right hip: Secondary | ICD-10-CM

## 2023-01-05 MED ORDER — EPINEPHRINE 0.3 MG/0.3ML IJ SOAJ: 0.3000 mg | INTRAMUSCULAR | 1 refills | Status: AC | PRN

## 2023-01-05 MED ORDER — MELOXICAM 7.5 MG PO TABS: 7.5000 mg | ORAL_TABLET | Freq: Every day | ORAL | 0 refills | Status: DC

## 2023-01-05 MED ORDER — LEVOTHYROXINE SODIUM 75 MCG PO TABS: 75.0000 ug | ORAL_TABLET | Freq: Every day | ORAL | 0 refills | Status: DC

## 2023-01-05 MED ORDER — VALACYCLOVIR HCL 500 MG PO TABS: 500.0000 mg | ORAL_TABLET | Freq: Two times a day (BID) | ORAL | 0 refills | Status: DC

## 2023-01-05 MED ORDER — SILDENAFIL CITRATE 20 MG PO TABS: ORAL_TABLET | ORAL | 0 refills | Status: DC

## 2023-01-05 NOTE — Progress Notes (Signed)
Subjective:   By signing my name below, I, George Castro, attest that this documentation has been prepared under the direction and in the presence of George Pear, NP 01/08/23   Patient ID: George Castro, male    DOB: [birth year], 52 y.o.   MRN: [id number]  Chief Complaint  Patient presents with   Hypothyroidism    Here for follow up   Back Pain    Complains of low back and right hip pain. Will like refill of Meloxicam.     Back Pain   Patient is in today for an office visit.   Hypothyroid: He reports he has been out of Synthroid for about a month.   Chronic pain: He has been taking Meloxicam for back, hip, and groin pains. He states that he has been dealing with back pain for many years but the hip and groin pain begun a couple of years ago. He has been doing exercises and stretching, which helped. He notes that he has not been going to the gym as often as he used to and has gained some weight back.   Hives: He reports last year he began breaking out in hives. He would previously have break outs and endorses redness and itching. He also reports episodes where his eyes and lips would become swollen. He states that he stopped drinking a moonshine that he noticed would trigger a break out.   Allergies: He has a known allergy to shellfish and peanuts. He has been eating peanut butter crackers often for many years. He has an epi pen. He has bought the Human resources officer for hives and reports it tends to work.  Immunizations: He has not received the flu vaccine this season and is not interested in receiving it today. He is receptive to receiving the second shingles vaccine today. He has not had the Covid vaccine but is interested in getting it.   Last Cologuard: 04/05/2022. Results were normal.  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   Hypothyroid 08/25/2012   Kidney stones    x 4 times    Past Surgical  History:  Procedure Laterality Date   LUMBAR SPINE SURGERY     L-4 and L-5    Family History  Problem Relation Age of Onset   Hyperlipidemia Maternal Grandmother    Arthritis Maternal Grandmother    Alcohol abuse Paternal Grandfather    Diabetes Neg Hx    Colon cancer Neg Hx    Esophageal cancer Neg Hx     Social History   Socioeconomic History   Marital status: Divorced    Spouse name: Not on file   Number of children: 8   Years of education: Not on file   Highest education level: Not on file  Occupational History   Not on file  Tobacco Use   Smoking status: Never   Smokeless tobacco: Never  Vaping Use   Vaping Use: Never used  Substance and Sexual Activity   Alcohol use: Yes    Comment: occasionally pt drinks   Drug use: No   Sexual activity: Yes    Partners: Female  Other Topics Concern   Not on file  Social History Narrative   Regular exercise: daily to the gym   Caffeine use: daily   Works as Administrator   Has SW degree from [employer]   8 children- all grown   10 grandchildren  Lives alone, single.    Social Determinants of Health   Financial Resource Strain: Not on file  Food Insecurity: Not on file  Transportation Needs: Not on file  Physical Activity: Not on file  Stress: Not on file  Social Connections: Not on file  Intimate Partner Violence: Not on file    Outpatient Medications Prior to Visit  Medication Sig Dispense Refill   cetirizine (ZYRTEC) 10 MG tablet Take 1 tablet (10 mg total) by mouth daily. 30 tablet 11   fluticasone (FLONASE) 50 MCG/ACT nasal spray Place 2 sprays into both nostrils daily. 16 g 6   EPINEPHrine (EPIPEN 2-PAK) 0.3 mg/0.3 mL IJ SOAJ injection Inject 0.3 mg into the muscle as needed for anaphylaxis. 2 each 1   levothyroxine (SYNTHROID) 75 MCG tablet Take 1 tablet (75 mcg total) by mouth daily before breakfast. 90 tablet 0   meloxicam (MOBIC) 7.5 MG tablet Take 1 tablet (7.5 mg total) by mouth daily. 14 tablet 0    sildenafil (REVATIO) 20 MG tablet TAKE ONE OR TWO TABLETS BY MOUTH 30 TO 60 MINUTES BEFORE SEXUAL ACTIVITY. DO NOT USE MORE THAN ONE DOSE DAILY 25 tablet 0   valACYclovir (VALTREX) 500 MG tablet Take 1 tablet (500 mg total) by mouth 2 (two) times daily. 60 tablet 0   No facility-administered medications prior to visit.    Allergies  Allergen Reactions   Peanuts [Peanut Oil] Hives   Shrimp [Shellfish Allergy] Hives   Penicillins Hives    Review of Systems  Musculoskeletal:  Positive for back pain (lower) and joint pain (hip/groin).  Skin:  Positive for itching and rash.       Objective:    Physical Exam Constitutional:      General: He is not in acute distress.    Appearance: Normal appearance. He is not ill-appearing.  HENT:     Head: Normocephalic and atraumatic.     Right Ear: External ear normal.     Left Ear: External ear normal.  Eyes:     Extraocular Movements: Extraocular movements intact.     Pupils: Pupils are equal, round, and reactive to light.  Cardiovascular:     Rate and Rhythm: Normal rate and regular rhythm.     Heart sounds: Normal heart sounds. No murmur heard.    No gallop.  Pulmonary:     Effort: Pulmonary effort is normal. No respiratory distress.     Breath sounds: Normal breath sounds. No wheezing or rales.  Skin:    General: Skin is warm and dry.  Neurological:     Mental Status: He is alert and oriented to person, place, and time.  Psychiatric:        Judgment: Judgment normal.     BP 119/81 (BP Location: Right Arm, Patient Position: Sitting, Cuff Size: Large)   Pulse 64   Temp 97.9 F (36.6 C) (Oral)   Resp 16   Wt 197 lb (89.4 kg)   SpO2 99%   BMI 28.27 kg/m  Wt Readings from Last 3 Encounters:  01/05/23 197 lb (89.4 kg)  01/20/22 207 lb 6.4 oz (94.1 kg)  01/09/20 205 lb 2 oz (93 kg)       Assessment & Plan:  Hypothyroidism, unspecified type Assessment & Plan: Clinically stable on synthroid. Obtain follow up TSH.    Orders: -     Comprehensive metabolic panel -     TSH  HSV-2 (herpes simplex virus 2) infection -     valACYclovir HCl; Take  1 tablet (500 mg total) by mouth 2 (two) times daily.  Dispense: 60 tablet; Refill: 0  Lumbar radiculopathy Assessment & Plan: Fair control. Requests refill on meloxicam.   Orders: -     Meloxicam; Take 1 tablet (7.5 mg total) by mouth daily.  Dispense: 14 tablet; Refill: 0  Right hip pain Assessment & Plan: Rx with meloxicam, refer to sports medicine.  Orders: -     Meloxicam; Take 1 tablet (7.5 mg total) by mouth daily.  Dispense: 14 tablet; Refill: 0 -     Ambulatory referral to Sports Medicine  Erectile dysfunction, unspecified erectile dysfunction type -     Sildenafil Citrate; TAKE ONE OR TWO TABLETS BY MOUTH 30 TO 60 MINUTES BEFORE SEXUAL ACTIVITY. DO NOT USE MORE THAN ONE DOSE DAILY  Dispense: 25 tablet; Refill: 0  Multiple food allergies Assessment & Plan: I advised pt to abstain from all peanut and shellfish containing foods. Keep epipen and liquid benadryl on hand at all times.   Orders: -     EPINEPHrine; Inject 0.3 mg into the muscle as needed for anaphylaxis.  Dispense: 2 each; Refill: 1  Microscopic hematuria -     Urinalysis, Routine w reflex microscopic  Need for shingles vaccine -     Varicella-zoster vaccine IM     I,Rachel Rivera,acting as a scribe for George Pear, NP.,have documented all relevant documentation on the behalf of George Pear, NP,as directed by  George Pear, NP while in the presence of George Pear, NP.   I, George Pear, NP, personally preformed the services described in this documentation.  All medical record entries made by the scribe were at my direction and in my presence.  I have reviewed the chart and discharge instructions (if applicable) and agree that the record reflects my personal performance and is accurate and complete. 01/08/23   George Pear,  NP

## 2023-01-06 ENCOUNTER — Telehealth: Payer: Self-pay | Admitting: Family

## 2023-01-06 LAB — URINALYSIS, ROUTINE W REFLEX MICROSCOPIC
Bilirubin Urine: NEGATIVE
Ketones, ur: NEGATIVE
Leukocytes,Ua: NEGATIVE
Nitrite: NEGATIVE
Specific Gravity, Urine: 1.025 (ref 1.000–1.030)
Total Protein, Urine: NEGATIVE
Urine Glucose: NEGATIVE
Urobilinogen, UA: 1 (ref 0.0–1.0)
pH: 6 (ref 5.0–8.0)

## 2023-01-06 LAB — COMPREHENSIVE METABOLIC PANEL
ALT: 9 U/L (ref 0–53)
AST: 17 U/L (ref 0–37)
Albumin: 4.2 g/dL (ref 3.5–5.2)
Alkaline Phosphatase: 42 U/L (ref 39–117)
BUN: 13 mg/dL (ref 6–23)
CO2: 28 mEq/L (ref 19–32)
Calcium: 9.4 mg/dL (ref 8.4–10.5)
Chloride: 104 mEq/L (ref 96–112)
Creatinine, Ser: 1.37 mg/dL (ref 0.40–1.50)
GFR: 59.49 mL/min — ABNORMAL LOW (ref >60.00)
Glucose, Bld: 84 mg/dL (ref 70–99)
Potassium: 4.1 mEq/L (ref 3.5–5.1)
Sodium: 140 mEq/L (ref 135–145)
Total Bilirubin: 0.7 mg/dL (ref 0.2–1.2)
Total Protein: 7.6 g/dL (ref 6.0–8.3)

## 2023-01-06 LAB — TSH: TSH: 5.03 u[IU]/mL (ref 0.35–5.50)

## 2023-01-06 MED ORDER — LEVOTHYROXINE SODIUM 88 MCG PO TABS: 88.0000 ug | ORAL_TABLET | Freq: Every day | ORAL | 1 refills | Status: DC

## 2023-01-06 NOTE — Telephone Encounter (Signed)
I would like him to increase synthroid from 80mg to 88 mcg. Repeat TSH in 6 weeks, dx hypothyroid. Urine is negative for blood.

## 2023-01-07 ENCOUNTER — Other Ambulatory Visit: Payer: Self-pay

## 2023-01-07 DIAGNOSIS — E039 Hypothyroidism, unspecified: Secondary | ICD-10-CM

## 2023-01-07 NOTE — Telephone Encounter (Signed)
Patient advised of results, provider's comments and medication dose increase.

## 2023-01-08 NOTE — Assessment & Plan Note (Signed)
Fair control. Requests refill on meloxicam.

## 2023-01-08 NOTE — Assessment & Plan Note (Addendum)
I advised pt to abstain from all peanut and shellfish containing foods. Keep epipen and liquid benadryl on hand at all times.

## 2023-01-08 NOTE — Assessment & Plan Note (Signed)
Rx with meloxicam, refer to sports medicine.

## 2023-01-08 NOTE — Assessment & Plan Note (Signed)
Clinically stable on synthroid. Obtain follow up TSH. 

## 2023-01-26 ENCOUNTER — Telehealth: Payer: Self-pay | Admitting: *Deleted

## 2023-01-26 ENCOUNTER — Encounter: Payer: No Typology Code available for payment source | Admitting: Family

## 2023-01-26 NOTE — Telephone Encounter (Signed)
Patient needed to reschedule appointment due to work.  Will not make it in time.

## 2023-01-26 NOTE — Telephone Encounter (Signed)
Provider Debbrah Alar - NP Contact Type Call Who Is Calling Patient / Member / Family / Caregiver Caller Name Bernarr Cristina Caller Phone Number [phone] Patient Name George Castro Patient DOB 1970/12/22 Call Type Message Only Information Provided Reason for Call Request for General Office Information Initial Comment Caller stated that they need to know when their appt is. Disp. Time Disposition Final User 01/25/2023 5:22:06 PM General Information Provided Yes McGehee, April

## 2023-02-03 ENCOUNTER — Other Ambulatory Visit: Payer: Self-pay | Admitting: *Deleted

## 2023-02-03 DIAGNOSIS — N529 Male erectile dysfunction, unspecified: Secondary | ICD-10-CM

## 2023-02-03 MED ORDER — SILDENAFIL CITRATE 20 MG PO TABS: ORAL_TABLET | ORAL | 0 refills | Status: DC

## 2023-02-09 ENCOUNTER — Encounter: Payer: No Typology Code available for payment source | Admitting: Family

## 2023-04-05 ENCOUNTER — Other Ambulatory Visit: Payer: Self-pay | Admitting: Family

## 2023-04-05 DIAGNOSIS — B009 Herpesviral infection, unspecified: Secondary | ICD-10-CM

## 2023-04-05 DIAGNOSIS — M5416 Radiculopathy, lumbar region: Secondary | ICD-10-CM

## 2023-04-05 DIAGNOSIS — N529 Male erectile dysfunction, unspecified: Secondary | ICD-10-CM

## 2023-04-05 DIAGNOSIS — M25551 Pain in right hip: Secondary | ICD-10-CM

## 2023-04-06 MED ORDER — VALACYCLOVIR HCL 500 MG PO TABS: 500.0000 mg | ORAL_TABLET | Freq: Two times a day (BID) | ORAL | 0 refills | Status: DC

## 2023-04-06 MED ORDER — SILDENAFIL CITRATE 20 MG PO TABS: ORAL_TABLET | ORAL | 0 refills | Status: DC

## 2023-04-06 MED ORDER — MELOXICAM 7.5 MG PO TABS: 7.5000 mg | ORAL_TABLET | Freq: Every day | ORAL | 0 refills | Status: DC

## 2023-08-24 ENCOUNTER — Other Ambulatory Visit: Payer: Self-pay | Admitting: Family

## 2023-08-24 DIAGNOSIS — M5416 Radiculopathy, lumbar region: Secondary | ICD-10-CM

## 2023-08-24 DIAGNOSIS — M25551 Pain in right hip: Secondary | ICD-10-CM

## 2023-08-24 DIAGNOSIS — B009 Herpesviral infection, unspecified: Secondary | ICD-10-CM

## 2023-08-24 DIAGNOSIS — N529 Male erectile dysfunction, unspecified: Secondary | ICD-10-CM

## 2023-08-24 MED ORDER — VALACYCLOVIR HCL 500 MG PO TABS: 500.0000 mg | ORAL_TABLET | Freq: Two times a day (BID) | ORAL | 0 refills | Status: DC

## 2023-08-25 MED ORDER — MELOXICAM 7.5 MG PO TABS: 7.5000 mg | ORAL_TABLET | Freq: Every day | ORAL | 0 refills | Status: DC

## 2023-08-25 MED ORDER — SILDENAFIL CITRATE 20 MG PO TABS: ORAL_TABLET | ORAL | 0 refills | Status: DC

## 2023-11-02 ENCOUNTER — Other Ambulatory Visit: Payer: Self-pay | Admitting: Family

## 2023-11-02 DIAGNOSIS — B009 Herpesviral infection, unspecified: Secondary | ICD-10-CM

## 2023-11-02 DIAGNOSIS — N529 Male erectile dysfunction, unspecified: Secondary | ICD-10-CM

## 2023-11-04 MED ORDER — VALACYCLOVIR HCL 500 MG PO TABS: 500.0000 mg | ORAL_TABLET | Freq: Two times a day (BID) | ORAL | 0 refills | Status: DC

## 2023-11-22 ENCOUNTER — Other Ambulatory Visit: Payer: Self-pay | Admitting: Family

## 2023-11-22 DIAGNOSIS — N529 Male erectile dysfunction, unspecified: Secondary | ICD-10-CM

## 2023-11-22 MED ORDER — SILDENAFIL CITRATE 20 MG PO TABS: ORAL_TABLET | ORAL | 0 refills | Status: DC

## 2024-02-07 ENCOUNTER — Other Ambulatory Visit: Payer: Self-pay | Admitting: Family

## 2024-02-07 DIAGNOSIS — N529 Male erectile dysfunction, unspecified: Secondary | ICD-10-CM

## 2024-02-07 DIAGNOSIS — B009 Herpesviral infection, unspecified: Secondary | ICD-10-CM

## 2024-02-07 NOTE — Telephone Encounter (Signed)
 Copied from CRM [id number]. Topic: Clinical - Medication Refill >> Feb 07, 2024  1:47 PM Pierre Bali B wrote: Most Recent Primary Care Visit:  Provider: O'SULLIVAN, MELISSA  Department: [facility]  Visit Type: OFFICE VISIT  Date: 01/05/2023  Medication: sildenafil (REVATIO) 20 MG tablet , valACYclovir (VALTREX) 500 MG tablet  Has the patient contacted their pharmacy? No (Agent: If no, request that the patient contact the pharmacy for the refill. If patient does not wish to contact the pharmacy document the reason why and proceed with request.) (Agent: If yes, when and what did the pharmacy advise?)  Is this the correct pharmacy for this prescription? Yes If no, delete pharmacy and type the correct one.  This is the patient's preferred pharmacy:  [facility] [address] [place] [address]  [place], [place] [address] Phone: [phone]    Has the prescription been filled recently? No  Is the patient out of the medication? Yes  Has the patient been seen for an appointment in the last year OR does the patient have an upcoming appointment? Yes  Can we respond through MyChart? Yes  Agent: Please be advised that Rx refills may take up to 3 business days. We ask that you follow-up with your pharmacy.

## 2024-02-08 ENCOUNTER — Other Ambulatory Visit: Payer: Self-pay

## 2024-02-08 ENCOUNTER — Ambulatory Visit: Payer: Self-pay | Admitting: Family

## 2024-02-08 DIAGNOSIS — B009 Herpesviral infection, unspecified: Secondary | ICD-10-CM

## 2024-02-08 DIAGNOSIS — N529 Male erectile dysfunction, unspecified: Secondary | ICD-10-CM

## 2024-02-08 MED ORDER — SILDENAFIL CITRATE 20 MG PO TABS: ORAL_TABLET | ORAL | 0 refills | Status: DC

## 2024-02-08 MED ORDER — VALACYCLOVIR HCL 500 MG PO TABS: 500.0000 mg | ORAL_TABLET | Freq: Two times a day (BID) | ORAL | 0 refills | Status: DC

## 2024-02-17 ENCOUNTER — Other Ambulatory Visit: Payer: Self-pay | Admitting: Family

## 2024-02-17 DIAGNOSIS — N529 Male erectile dysfunction, unspecified: Secondary | ICD-10-CM

## 2024-03-09 ENCOUNTER — Other Ambulatory Visit: Payer: Self-pay | Admitting: Family

## 2024-03-09 DIAGNOSIS — N529 Male erectile dysfunction, unspecified: Secondary | ICD-10-CM

## 2024-05-18 ENCOUNTER — Telehealth: Payer: Self-pay | Admitting: Family

## 2024-05-18 NOTE — Telephone Encounter (Signed)
 Chart not reviewed, last OV 01/2023. Needs appt

## 2024-05-18 NOTE — Telephone Encounter (Signed)
 Copied from CRM [id number]. Topic: Clinical - Medication Refill >> May 18, 2024 12:00 PM Harlene ORN wrote: Medication: sildenafil  (REVATIO ) 20 MG tablet  Has the patient contacted their pharmacy? Yes (Agent: If no, request that the patient contact the pharmacy for the refill. If patient does not wish to contact the pharmacy document the reason why and proceed with request.) (Agent: If yes, when and what did the pharmacy advise?)  This is the patient's preferred pharmacy:  [facility] [address], [place] - [address] [place] [address] [address] Phone: [phone] Fax: [phone]  Is this the correct pharmacy for this prescription? Yes If no, delete pharmacy and type the correct one.   Has the prescription been filled recently? No  Is the patient out of the medication? Yes  Has the patient been seen for an appointment in the last year OR does the patient have an upcoming appointment? No  Can we respond through MyChart? Yes  Agent: Please be advised that Rx refills may take up to 3 business days. We ask that you follow-up with your pharmacy.

## 2024-05-24 ENCOUNTER — Other Ambulatory Visit: Payer: Self-pay | Admitting: Family

## 2024-05-24 DIAGNOSIS — N529 Male erectile dysfunction, unspecified: Secondary | ICD-10-CM

## 2024-05-24 MED ORDER — SILDENAFIL CITRATE 20 MG PO TABS: ORAL_TABLET | ORAL | 0 refills | Status: DC

## 2024-05-24 NOTE — Telephone Encounter (Signed)
 Copied from CRM [id number]. Topic: Clinical - Medication Refill >> May 24, 2024  3:23 PM Carla L wrote: Medication: sildenafil  (REVATIO ) 20 MG tablet Patient requesting courtesy refill until able to be seen. Patient scheduled follow up on 05/30/24.   Has the patient contacted their pharmacy? Yes  This is the patient's preferred pharmacy:  [facility] [address] [place] - [address] [place] [address] [address] [place] [address] Phone: [phone] Fax: [phone]  Is this the correct pharmacy for this prescription? Yes  Has the prescription been filled recently? No  Is the patient out of the medication? Yes  Has the patient been seen for an appointment in the last year OR does the patient have an upcoming appointment? Yes  Can we respond through MyChart? No  Agent: Please be advised that Rx refills may take up to 3 business days. We ask that you follow-up with your pharmacy.

## 2024-05-30 ENCOUNTER — Other Ambulatory Visit (HOSPITAL_COMMUNITY)
Admission: RE | Admit: 2024-05-30 | Discharge: 2024-05-30 | Disposition: A | Discharge status: Home / Self Care | Source: Ambulatory Visit | Attending: Family | Admitting: Family

## 2024-05-30 ENCOUNTER — Ambulatory Visit (INDEPENDENT_AMBULATORY_CARE_PROVIDER_SITE_OTHER): Payer: Self-pay | Admitting: Family

## 2024-05-30 ENCOUNTER — Other Ambulatory Visit (HOSPITAL_BASED_OUTPATIENT_CLINIC_OR_DEPARTMENT_OTHER): Payer: Self-pay

## 2024-05-30 VITALS — BP 94/60 | HR 55 | Temp 98.1°F | Resp 16 | Ht 70.0 in | Wt 200.0 lb

## 2024-05-30 DIAGNOSIS — M5416 Radiculopathy, lumbar region: Secondary | ICD-10-CM | POA: Diagnosis not present

## 2024-05-30 DIAGNOSIS — Z23 Encounter for immunization: Secondary | ICD-10-CM | POA: Diagnosis not present

## 2024-05-30 DIAGNOSIS — M25551 Pain in right hip: Secondary | ICD-10-CM | POA: Diagnosis not present

## 2024-05-30 DIAGNOSIS — E663 Overweight: Secondary | ICD-10-CM | POA: Insufficient documentation

## 2024-05-30 DIAGNOSIS — R221 Localized swelling, mass and lump, neck: Secondary | ICD-10-CM | POA: Insufficient documentation

## 2024-05-30 DIAGNOSIS — N529 Male erectile dysfunction, unspecified: Secondary | ICD-10-CM | POA: Diagnosis not present

## 2024-05-30 DIAGNOSIS — B009 Herpesviral infection, unspecified: Secondary | ICD-10-CM

## 2024-05-30 DIAGNOSIS — Z113 Encounter for screening for infections with a predominantly sexual mode of transmission: Secondary | ICD-10-CM | POA: Insufficient documentation

## 2024-05-30 DIAGNOSIS — E039 Hypothyroidism, unspecified: Secondary | ICD-10-CM

## 2024-05-30 DIAGNOSIS — Z6828 Body mass index (BMI) 28.0-28.9, adult: Secondary | ICD-10-CM

## 2024-05-30 MED ORDER — VALACYCLOVIR HCL 500 MG PO TABS
500.0000 mg | ORAL_TABLET | Freq: Two times a day (BID) | ORAL | 0 refills | Status: DC
  Filled 2024-05-30: qty 60, 30d supply, fill #0

## 2024-05-30 MED ORDER — SILDENAFIL CITRATE 20 MG PO TABS
20.0000 mg | ORAL_TABLET | Freq: Every day | ORAL | 0 refills | Status: DC
  Filled 2024-05-30: qty 25, 12d supply, fill #0

## 2024-05-30 MED ORDER — LEVOTHYROXINE SODIUM 88 MCG PO TABS
88.0000 ug | ORAL_TABLET | Freq: Every day | ORAL | 0 refills | Status: DC
Start: 2024-05-30 — End: 2024-08-21
  Filled 2024-05-30: qty 90, 90d supply, fill #0

## 2024-05-30 MED ORDER — MELOXICAM 7.5 MG PO TABS
7.5000 mg | ORAL_TABLET | Freq: Every day | ORAL | 0 refills | Status: DC | PRN
  Filled 2024-05-30: qty 30, 30d supply, fill #0

## 2024-05-30 NOTE — Assessment & Plan Note (Signed)
 Stable, continue sildenafil .

## 2024-05-30 NOTE — Patient Instructions (Signed)
 VISIT SUMMARY:  Today, we discussed your ongoing issues with weight management, chronic pain, and thyroid  health. We also addressed your neck pain and provided recommendations for general health maintenance.  YOUR PLAN:  HYPOTHYROIDISM: Your thyroid  issues and non-adherence to levothyroxine  may be affecting your weight loss efforts. -We have sent a prescription for levothyroxine  to the pharmacy downstairs. -We will check your TSH level today. -Repeat the thyroid  test in six weeks after resuming your medication.  OBESITY (BMI 28): Your BMI is 28, and you are having difficulty losing weight due to high sugar intake. -Reduce your sugar intake, especially from sweet tea. -Consider using Crystal Light or other sugar-free options as alternatives. -Continue tracking your calories using My Fitness Pal or a similar app.  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 -We will refer you to physical therapy at the [facility] location. -We have prescribed meloxicam  for anti-inflammatory treatment.  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 -Use a cervical neck pillow for support. -Maintain good body mechanics, especially while driving. -Use meloxicam  for relief on bad days.  GENERAL HEALTH MAINTENANCE: We discussed hepatitis B vaccination and potential screenings for hepatitis C and HIV. -You received the first dose of the hepatitis B vaccine today. -Schedule the second dose of the hepatitis B vaccine in one to two months.

## 2024-05-30 NOTE — Assessment & Plan Note (Signed)
 Stable with valtrex , continue same.

## 2024-05-30 NOTE — Assessment & Plan Note (Addendum)
 Non-adherence to levothyroxine  likely elevated TSH, affecting weight loss. - Send levothyroxine  prescription to pharmacy downstairs. - Check TSH level today. - Repeat thyroid  test in six weeks after resuming medication.

## 2024-05-30 NOTE — Assessment & Plan Note (Addendum)
 Right- radiates into buttock. Previously did not have time for PT. Would like to pursue. Rx meloxicam  prn.

## 2024-05-30 NOTE — Assessment & Plan Note (Signed)
  BMI 28 with difficulty losing weight due to high sugar intake. Not eligible for weight loss medications. - Recommend reducing sugar intake, particularly from sweet tea. - Suggest using Crystal Light or sugar-free options as alternatives. - Advise calorie tracking using My Fitness Pal or similar app.

## 2024-05-30 NOTE — Progress Notes (Signed)
 Subjective:     Patient ID: George Castro, male    DOB: 04/06/1971, 53 y.o.   MRN: [id number]  Chief Complaint  Patient presents with   Hypothyroidism    Here for follow up    HPI  Discussed the use of AI scribe software for clinical note transcription with the patient, who gave verbal consent to proceed.  History of Present Illness  George Castro is a 52 year old male who presents for a follow-up on medications and weight management.  He is experiencing persistent weight gain and seeks advice on weight loss strategies. His diet is poor with high sugar intake, primarily from sweet tea. He uses a calorie counting app to monitor intake. He previously lost weight from 205 to 188 pounds but finds maintaining weight loss challenging.  He experiences constant deep tissue pain in his right leg and low back for the past two to four years, sometimes intensifying while driving. He uses a Training and development officer or softball for relief. He is considering resuming physical therapy now that he is back in [place].  He has thyroid  issues and has not taken Synthroid  (levothyroxine ) 88 mcg for a while, which may affect weight loss efforts.  He experiences neck pain and stiffness with popping sounds when turning his neck, described as 'excruciating' at times. He has been waking up with stress-related neck issues over the past few months.     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   Hypothyroid 08/25/2012   Kidney stones    x 4 times    Past Surgical History:  Procedure Laterality Date   LUMBAR SPINE SURGERY     L-4 and L-5    Family History  Problem Relation Age of Onset   Hyperlipidemia  Maternal Grandmother    Arthritis Maternal Grandmother    Alcohol abuse Paternal Grandfather    Diabetes Neg Hx    Colon cancer Neg Hx    Esophageal cancer Neg Hx     Social History   Socioeconomic History   Marital status: Divorced    Spouse name: Not on file   Number of children: 8   Years of education: Not on file   Highest education level: Not on file  Occupational History   Not on file  Tobacco Use   Smoking status: Never   Smokeless tobacco: Never  Vaping Use   Vaping status: Never Used  Substance and Sexual Activity   Alcohol use: Yes    Comment: occasionally pt drinks   Drug use: No   Sexual activity: Yes    Partners: Female  Other Topics Concern   Not on file  Social History Narrative   Regular exercise: daily to the gym   Caffeine use: daily   Works as Naval architect   Has SW degree from [employer]   8 children- all grown   10 grandchildren   Lives alone, single.    Social Drivers of Corporate investment banker Strain: Not on file  Food Insecurity: Not on file  Transportation Needs: Not on file  Physical Activity: Not on file  Stress: Not  on file  Social Connections: Unknown (03/16/2022)   Received from [facility]   Social Network    Social Network: Not on file  Intimate Partner Violence: Unknown (02/03/2022)   Received from [facility]   HITS    Physically Hurt: Not on file    Insult or Talk Down To: Not on file    Threaten Physical Harm: Not on file    Scream or Curse: Not on file    Outpatient Medications Prior to Visit  Medication Sig Dispense Refill   cetirizine  (ZYRTEC ) 10 MG tablet Take 1 tablet (10 mg total) by mouth daily. 30 tablet 11   EPINEPHrine  (EPIPEN  2-PAK) 0.3 mg/0.3 mL IJ SOAJ injection Inject 0.3 mg into the muscle as needed for anaphylaxis. 2 each 1   fluticasone  (FLONASE ) 50 MCG/ACT nasal spray Place 2 sprays into both nostrils daily. 16 g 6   levothyroxine  (SYNTHROID ) 88 MCG tablet Take 1 tablet (88 mcg total) by mouth daily.  90 tablet 1   meloxicam  (MOBIC ) 7.5 MG tablet Take 1 tablet (7.5 mg total) by mouth daily. 14 tablet 0   sildenafil  (REVATIO ) 20 MG tablet TAKE ONE OR TWO TABLETS BY MOUTH 30 TO 60 MINUTES BEFORE SEXUAL ACTIVITY. 25 tablet 0   valACYclovir  (VALTREX ) 500 MG tablet Take 1 tablet (500 mg total) by mouth 2 (two) times daily. 60 tablet 0   No facility-administered medications prior to visit.    Allergies  Allergen Reactions   Peanuts [Peanut Oil] Hives   Shrimp [Shellfish Allergy] Hives   Penicillins Hives    ROS    See HPI Objective:    Physical Exam Constitutional:      General: He is not in acute distress.    Appearance: He is well-developed.  HENT:     Head: Normocephalic and atraumatic.  Neck:     Thyroid : Thyroid  mass (approximately 1 cm wide mass left thyroid  lobe) present.  Cardiovascular:     Rate and Rhythm: Normal rate and regular rhythm.     Heart sounds: No murmur heard. Pulmonary:     Effort: Pulmonary effort is normal. No respiratory distress.     Breath sounds: Normal breath sounds. No wheezing or rales.  Skin:    General: Skin is warm and dry.  Neurological:     Mental Status: He is alert and oriented to person, place, and time.  Psychiatric:        Behavior: Behavior normal.        Thought Content: Thought content normal.      BP 94/60 (BP Location: Right Arm, Patient Position: Sitting, Cuff Size: Large)   Pulse (!) 55   Temp 98.1 F (36.7 C) (Oral)   Resp 16   Ht 5' 10 (1.778 m)   Wt 200 lb (90.7 kg)   SpO2 98%   BMI 28.70 kg/m  Wt Readings from Last 3 Encounters:  05/30/24 200 lb (90.7 kg)  01/05/23 197 lb (89.4 kg)  01/20/22 207 lb 6.4 oz (94.1 kg)       Assessment & Plan:   Problem List Items Addressed This Visit       Unprioritized   RESOLVED: Right hip pain   Relevant Medications   meloxicam  (MOBIC ) 7.5 MG tablet   Overweight with body mass index (BMI) of 28 to 28.9 in adult    BMI 28 with difficulty losing weight due to  high sugar intake. Not eligible for weight loss medications. - Recommend reducing sugar intake, particularly from sweet  tea. - Suggest using Crystal Light or sugar-free options as alternatives. - Advise calorie tracking using My Fitness Pal or similar app.       Neck mass   ? Thyroid  nodule. Will obtain thyroid  US  for further evaluation.       Relevant Orders   US  THYROID    Lumbar radiculopathy - Primary   Right- radiates into buttock. Previously did not have time for PT. Would like to pursue. Rx meloxicam  prn.       Relevant Medications   meloxicam  (MOBIC ) 7.5 MG tablet   Other Relevant Orders   Ambulatory referral to Physical Therapy   Hypothyroid   Non-adherence to levothyroxine  likely elevated TSH, affecting weight loss. - Send levothyroxine  prescription to pharmacy downstairs. - Check TSH level today. - Repeat thyroid  test in six weeks after resuming medication.      Relevant Medications   levothyroxine  (SYNTHROID ) 88 MCG tablet   Other Relevant Orders   TSH   Comp Met (CMET)   HSV-2 (herpes simplex virus 2) infection   Stable with valtrex , continue same.       Relevant Medications   valACYclovir  (VALTREX ) 500 MG tablet   Erectile dysfunction   Stable, continue sildenafil .       Relevant Medications   sildenafil  (REVATIO ) 20 MG tablet   Other Visit Diagnoses       Routine screening for STI (sexually transmitted infection)       Relevant Orders   HepB+HepC+HIV Panel   RPR   Urine cytology ancillary only      Addendum: Pt requested full STD panel but when he got to the lab, the only lab he permitted to be drawn was the RPR.   I have changed George A. Cedrone's meloxicam , sildenafil , and valACYclovir . I am also having him maintain his fluticasone , cetirizine , EPINEPHrine , and levothyroxine .  Meds ordered this encounter  Medications   meloxicam  (MOBIC ) 7.5 MG tablet    Sig: Take 1 tablet (7.5 mg total) by mouth daily as needed for pain.    Dispense:  30  tablet    Refill:  0    Supervising Provider:   DOMENICA BLACKBIRD A [[id number]]   sildenafil  (REVATIO ) 20 MG tablet    Sig: Take 1 or 2 tablets (20 or 40 mg total) by mouth 30 to 60 minutes before sexual activity.    Dispense:  25 tablet    Refill:  0    Supervising Provider:   DOMENICA BLACKBIRD A [[id number]]   valACYclovir  (VALTREX ) 500 MG tablet    Sig: Take 1 tablet (500 mg total) by mouth 2 (two) times daily. *Need appointment for future refills.*    Dispense:  60 tablet    Refill:  0    Requested drug refills are authorized, however, the patient needs further evaluation and/or laboratory testing before further refills are given. Ask him to make an appointment for this.    Supervising Provider:   DOMENICA BLACKBIRD LABOR [[id number]]   levothyroxine  (SYNTHROID ) 88 MCG tablet    Sig: Take 1 tablet (88 mcg total) by mouth daily.    Dispense:  90 tablet    Refill:  0    Supervising Provider:   DOMENICA BLACKBIRD A [[id number]]

## 2024-05-30 NOTE — Assessment & Plan Note (Signed)
?   Thyroid  nodule. Will obtain thyroid  US  for further evaluation.

## 2024-05-31 LAB — URINE CYTOLOGY ANCILLARY ONLY
Chlamydia: NEGATIVE
Comment: NEGATIVE
Comment: NEGATIVE
Comment: NORMAL
Neisseria Gonorrhea: NEGATIVE
Trichomonas: NEGATIVE

## 2024-05-31 LAB — RPR: RPR Ser Ql: NONREACTIVE

## 2024-06-01 ENCOUNTER — Ambulatory Visit: Payer: Self-pay | Admitting: Family

## 2024-06-01 DIAGNOSIS — E041 Nontoxic single thyroid nodule: Secondary | ICD-10-CM

## 2024-06-06 ENCOUNTER — Ambulatory Visit (HOSPITAL_BASED_OUTPATIENT_CLINIC_OR_DEPARTMENT_OTHER)
Admission: RE | Admit: 2024-06-06 | Discharge: 2024-06-06 | Disposition: A | Discharge status: Home / Self Care | Source: Ambulatory Visit | Attending: Family | Admitting: Family

## 2024-06-06 DIAGNOSIS — R221 Localized swelling, mass and lump, neck: Secondary | ICD-10-CM | POA: Insufficient documentation

## 2024-06-08 DIAGNOSIS — E041 Nontoxic single thyroid nodule: Secondary | ICD-10-CM | POA: Insufficient documentation

## 2024-06-14 NOTE — Therapy (Addendum)
 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   Hypothyroid 08/25/2012   Kidney stones    x 4 times   Past Surgical History:  Procedure Laterality Date   LUMBAR SPINE SURGERY     L-4 and L-5   Patient Active Problem List   Diagnosis Date Noted   Thyroid  nodule 06/08/2024   Neck mass 05/30/2024   Overweight with body mass index (BMI) of 28 to 28.9 in adult 05/30/2024   Erectile dysfunction 01/05/2023   Lumbar radiculopathy 01/20/2022   Multiple food allergies 12/05/2012   Hypothyroid 08/25/2012   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   Routine general medical examination at a health care facility 08/17/2012    PCP: Daryl Setter, NP   REFERRING PROVIDER: Daryl Setter, NP   REFERRING DIAG: M54.16 (ICD-10-CM) - Lumbar radiculopathy  THERAPY DIAG:  Pain in right leg  Other low back pain  Muscle weakness (generalized)  Stiffness of right hip, not elsewhere classified  RATIONALE FOR EVALUATION AND TREATMENT: Rehabilitation  ONSET DATE: M54.16 (ICD-10-CM) - Lumbar radiculopathy  NEXT MD VISIT:    SUBJECTIVE:                                                                                                                                                                                                         SUBJECTIVE STATEMENT: Patient is referred to PT for lumbar radiculopathy from  PCP.   He states he has back pain, but his biggest c/o is his posterior R thigh around his lateral ischial tuberosity.   He states this pain is constant and aggravating with variable severity.  He c/o point tenderness in this area.  States he has had the pain off and on x 4 yrs.   He cannot recall any trauma to the area.   He states he tried to play basketball about a year ago  and was in severe pain in the R posterior thigh afterwards and it has been worse since then. He has a   history of L4/5 back surgery--patient states it was an ablation that did help for a while but that was more for back pain.  He states he is not sure the R posterior thigh pain and his back pain are connected.  He denies any h/o hamstring tear or strains in the past.   He denies pain going up/down steps.   He is doing hamstring  and hip stretching at home regularly and a lot of self massage of the area by sitting and rolling his leg over a lacrosse-type ball.   He works as a magazine features editor time.  He wants to get back into playing sports which he hasn't been able to do in several years due to his pain in the RLE  PAIN: Are you having pain? Yes: NPRS scale: 6/10;  10/10 worst  Pain location: R posterior thigh around ischial tuberosity deep pain Pain description: throbbing, aching all the time Aggravating factors: playing basketball Relieving factors: deep tissue massage with a hard lacrosse ball  PERTINENT HISTORY:  herpes simplex 2 infection, hypothyroidism, back surgery  PRECAUTIONS: None  RED FLAGS: None  WEIGHT BEARING RESTRICTIONS: No  FALLS:  Has patient fallen in last 6 months? 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 Normal alignment. No fracture. SI joints are symmetric and unremarkable.   IMPRESSION: No acute findings.     Electronically Signed   By: Franky Crease M.D.   On: 04/06/2016 07:59  PATIENT SURVEYS:  ODI = 11/50 22%  SCREENING FOR RED FLAGS: Bowel or bladder incontinence: No Spinal tumors: No Cauda equina syndrome: No Compression fracture: No Abdominal aneurysm: No  COGNITION:  Overall cognitive status: Within functional limits for tasks assessed    SENSATION: WFL  POSTURE:  L lateral shifted lumbar spine;  leg lengths are equal in supine;  considerably inverted L heel in supine  PALPATION: TTP over the R paraspinals, R posterior greater trochanter; R ischial tuberosity, R medial and lateral hamstrings myotendinous junction throughout muscle belly Most tender of the R ischial tuberosity Not TTP over R hip/piriformis  LUMBAR ROM:   Active  Eval  Flexion Touches toes; min pain  Extension 75% has significant leftward shift with ROM; some back pain  Right lateral flexion Fingers to knee jt line  Left lateral flexion Fingers to knee jt line  Right rotation 100%; no pain  Left rotation 100% no pain  (Blank rows = not tested)  MUSCLE LENGTH: Hamstrings: Right SLR 100 deg; Left SLR 100+ deg Thomas test: Right 20-30 deg; Left NT deg Hamstrings: 90/90 is 0 deg BLE ITB: not tight R or LLE Piriformis: mild to mod tightness RLE Hip flexors: not tight Quads: min tightness Heelcord: tight BLE R>L  LOWER EXTREMITY ROM:     Active  Right eval Left eval  Hip flexion    Hip extension    Hip abduction    Hip adduction    Hip internal rotation  Hip external rotation    Knee flexion    Knee extension    Ankle dorsiflexion    Ankle plantarflexion    Ankle inversion    Ankle eversion    (Blank rows =  not tested)  LOWER EXTREMITY MMT:    MMT Right eval Left eval  Hip flexion 4 5  Hip extension 4; p! 4  Hip abduction 4- 4  Hip adduction    Hip internal rotation 4+ 5  Hip external rotation 4+ 5  Knee flexion 4+; p! 5  Knee extension 5 5  Ankle dorsiflexion 5 5  Ankle plantarflexion 5 5  Ankle inversion    Ankle eversion     (Blank rows = not tested)  Nordic curls attempted and reproduce the patient's R posterior thigh pain more than any other test  LUMBAR SPECIAL TESTS:  Straight leg raise test: Negative, Slump test: Negative, FABER test: Negative, and Thomas test: Negative FABER causes some R groin to lateral hip pain  FUNCTIONAL TESTS:  TBD  GAIT: Distance walked: unlimited distances Assistive device utilized: None Level of assistance: Complete Independence Gait pattern: see below Comments: supinates LLE > R in stance, has slight L lateral pelvic shift   TODAY'S TREATMENT:   SELF CARE: Provided education on PT POC progression, for pain management options, and on home TENS unit set-up and use; initial HEP instructions  PATIENT EDUCATION:  Education details: PT eval findings, anticipated POC, and initial HEP  Person educated: Patient Education method: Explanation, Verbal cues, Tactile cues, and Handouts Education comprehension: verbalized understanding, verbal cues required, tactile cues required, and needs further education  HOME EXERCISE PROGRAM: Access Code: [email or web address] URL: [email or web address] Date: 06/15/2024 Prepared by: Garnette Montclair  Exercises - Piriformis Mobilization on Foam Roll  - 1 x daily - 7 x weekly - 3 sets - 10 reps - Supine Figure 4 Piriformis Stretch  - 1 x daily - 7 x weekly - 1 sets - 2 reps - 1 min hold - Supine Piriformis Stretch  - 1 x daily - 7 x weekly - 1 sets - 2 reps - 1 min hold - Single Leg Bridge  - 1 x daily - 7 x weekly - 3 sets - 10 reps - Kneeling Eccentric Hamstring Strengthening with Caregiver  - 1 x  daily - 7 x weekly - 3 sets - 10 reps - Prone Hip Extension  - 1 x daily - 7 x weekly - 3 sets - 10 reps   ASSESSMENT:  CLINICAL IMPRESSION: George Castro is a 53 y.o. male who was referred to physical therapy for evaluation and treatment for lumbar radiculopathy.    Patient reports onset of RLE pain beginning 4 yrs ago for no apparent reason.  He was formerly very active in sports and now is unable to participate due to pain.   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   States his pain is worst in sitting.   He is doing a lot of hamstring stretching, and has good lumbar ROM.  However, he has some piriformis tightness.  He is not able to bend backward without listing to the left.   He also has increasec pain with resisted hamstring movements, especially nordic curls.    Patient has deficits in lumbar and hip ROM, R hip LE flexibility, RLE strength, abnormal posture, and TTP with abnormal muscle tension in the R hamstring origin which are interfering with ADLs and are impacting quality of life.  On Modified Oswestry patient scored 11/50 demonstrating 22% o disability.  Deangelo will benefit from skilled PT to address above deficits to improve mobility and activity tolerance with decreased pain interference.     OBJECTIVE IMPAIRMENTS: Abnormal gait, decreased activity tolerance, decreased ROM, decreased strength, impaired flexibility, postural dysfunction, and pain.   ACTIVITY LIMITATIONS: sitting and locomotion level  PARTICIPATION LIMITATIONS: community activity, occupation, and sports participation  PERSONAL FACTORS: Age and Time since onset of injury/illness/exacerbation are also affecting patient's functional outcome.   REHAB POTENTIAL: Good  CLINICAL DECISION MAKING: Evolving/moderate complexity  EVALUATION COMPLEXITY: Moderate   GOALS: Goals reviewed with patient? Yes  SHORT TERM GOALS: Target date: 07/14/2024   Patient will be independent with initial HEP to improve  outcomes and carryover.  Baseline: 100% PT assist required for correct completion Goal status: INITIAL  2.  Patient will report 25% improvement in low back pain to improve QOL. Baseline: 10/10 worst Goal status: INITIAL LONG TERM GOALS: Target date: 08/11/2024   Patient will be independent with ongoing/advanced HEP for self-management at home.  Baseline: no advanced HEP yet Goal status: INITIAL  2.  Patient will report 50-75% improvement in low back pain to improve QOL.  Baseline: 10/10 worst Goal status: INITIAL  3.  Patient to demonstrate ability to achieve and maintain good spinal alignment/posturing and body mechanics needed for daily activities. Baseline: L lateral lumbar/pelvic shift Goal status: INITIAL  4.  Patient will demonstrate full pain free lumbar ROM to perform ADLs.   Baseline: Refer to above lumbar ROM table Goal status: INITIAL  5.  Patient will demonstrate improved RLE strength to >/= 5/5 for improved stability and ease of mobility. Baseline: Refer to above LE MMT table Goal status: INITIAL  6. Patient will report </= 10% on Modified Oswestry (MCID = 12%) to demonstrate improved functional ability with decreased pain interference. Baseline: 22% Goal status: INITIAL  7.  Patient will tolerate 8 hrs of sitting w/o increased pain to allow for increased tolerance for working as truck driver  Baseline: works 8 hrs driving truck, but is in pain Goal status: INITIAL  8.  Patient will be able to 5-10 nordic curls without increased RLE pain Baseline: unable to complete 1 Goal status: INITIAL  PLAN:  PT FREQUENCY: 1x/week  PT DURATION: 8 weeks  PLANNED INTERVENTIONS: 02835- PT Re-evaluation, 97750- Physical Performance Testing, 97110-Therapeutic exercises, 97530- Therapeutic activity, 97112- Neuromuscular re-education, 97535- Self Care, 02859- Manual therapy, [id number]- Electrical stimulation (unattended), 97035- Ultrasound, 02987- Traction (mechanical), [id number]-  Ionotophoresis 4mg /ml Dexamethasone, 79439 (1-2 muscles), 20561 (3+ muscles)- Dry Needling, Patient/Family education, Balance training, Stair training, Taping, Joint mobilization, Spinal mobilization, Cryotherapy, and Moist heat  PLAN FOR NEXT SESSION: progress with pelvic sideglides and prone extensions for postural correction; assess how hamstring strenghtening is affecting symptoms, add more hamstring eccentrics slowly as able; asses if US  helped any  PHYSICAL THERAPY DISCHARGE SUMMARY  Visits from Start of Care: 1  Current functional level related to goals / functional outcomes: PER INITIAL EVALUATION NOTE UNDER ASSESSMENTS ABOVE   Remaining deficits: NO CHANGE--ONLY ATTENDED 1 VISIT;  no show x 3 and will be D/C   Education / Equipment: Initial HEP   Patient agrees to discharge. Patient goals were not met. Patient is being discharged due to not returning since the last visit.   Malissie Musgrave, PT 06/16/2024, 1:01 PM

## 2024-06-15 ENCOUNTER — Ambulatory Visit: Attending: Family | Admitting: Rehabilitation

## 2024-06-15 ENCOUNTER — Encounter: Payer: Self-pay | Admitting: Rehabilitation

## 2024-06-15 ENCOUNTER — Other Ambulatory Visit: Payer: Self-pay

## 2024-06-15 DIAGNOSIS — M25651 Stiffness of right hip, not elsewhere classified: Secondary | ICD-10-CM | POA: Insufficient documentation

## 2024-06-15 DIAGNOSIS — M6281 Muscle weakness (generalized): Secondary | ICD-10-CM | POA: Diagnosis present

## 2024-06-15 DIAGNOSIS — M5459 Other low back pain: Secondary | ICD-10-CM | POA: Diagnosis present

## 2024-06-15 DIAGNOSIS — M79604 Pain in right leg: Secondary | ICD-10-CM | POA: Insufficient documentation

## 2024-06-15 DIAGNOSIS — M5416 Radiculopathy, lumbar region: Secondary | ICD-10-CM | POA: Diagnosis not present

## 2024-06-23 ENCOUNTER — Encounter: Payer: Self-pay | Admitting: Family

## 2024-07-05 ENCOUNTER — Ambulatory Visit: Admitting: Rehabilitation

## 2024-07-11 ENCOUNTER — Ambulatory Visit: Admitting: Family

## 2024-07-14 ENCOUNTER — Ambulatory Visit: Admitting: Family

## 2024-08-21 ENCOUNTER — Other Ambulatory Visit: Payer: Self-pay | Admitting: Family

## 2024-08-21 ENCOUNTER — Other Ambulatory Visit: Payer: Self-pay

## 2024-08-21 ENCOUNTER — Other Ambulatory Visit (HOSPITAL_BASED_OUTPATIENT_CLINIC_OR_DEPARTMENT_OTHER): Payer: Self-pay

## 2024-08-21 DIAGNOSIS — M25551 Pain in right hip: Secondary | ICD-10-CM

## 2024-08-21 DIAGNOSIS — N529 Male erectile dysfunction, unspecified: Secondary | ICD-10-CM

## 2024-08-21 DIAGNOSIS — M5416 Radiculopathy, lumbar region: Secondary | ICD-10-CM

## 2024-08-21 DIAGNOSIS — B009 Herpesviral infection, unspecified: Secondary | ICD-10-CM

## 2024-08-21 DIAGNOSIS — E039 Hypothyroidism, unspecified: Secondary | ICD-10-CM

## 2024-08-21 MED ORDER — MELOXICAM 7.5 MG PO TABS
7.5000 mg | ORAL_TABLET | Freq: Every day | ORAL | 0 refills | Status: AC | PRN
  Filled 2024-08-21: qty 30, 30d supply, fill #0

## 2024-08-21 MED ORDER — VALACYCLOVIR HCL 500 MG PO TABS
500.0000 mg | ORAL_TABLET | Freq: Two times a day (BID) | ORAL | 5 refills | Status: AC
  Filled 2024-08-21: qty 60, 30d supply, fill #0

## 2024-08-21 MED ORDER — SILDENAFIL CITRATE 20 MG PO TABS
20.0000 mg | ORAL_TABLET | Freq: Every day | ORAL | 1 refills | Status: AC
  Filled 2024-08-21: qty 25, 12d supply, fill #0

## 2024-08-21 MED ORDER — LEVOTHYROXINE SODIUM 88 MCG PO TABS
88.0000 ug | ORAL_TABLET | Freq: Every day | ORAL | 1 refills | Status: AC
  Filled 2024-08-21: qty 90, 90d supply, fill #0
